# Patient Record
Sex: Male | Born: 1980 | Race: Black or African American | Hispanic: No | Marital: Single | Smoking: Former smoker
Health system: Southern US, Community
[De-identification: ages and names within clinical notes are randomized; demographics above are authoritative.]

## PROBLEM LIST (undated history)

## (undated) DIAGNOSIS — E119 Type 2 diabetes mellitus without complications: Secondary | ICD-10-CM

## (undated) DIAGNOSIS — I639 Cerebral infarction, unspecified: Secondary | ICD-10-CM

## (undated) DIAGNOSIS — E785 Hyperlipidemia, unspecified: Secondary | ICD-10-CM

## (undated) HISTORY — DX: Hyperlipidemia, unspecified: E78.5

## (undated) HISTORY — PX: WISDOM TOOTH EXTRACTION: SHX21

---

## 2003-09-16 ENCOUNTER — Emergency Department (HOSPITAL_COMMUNITY): Admission: EM | Admit: 2003-09-16 | Discharge: 2003-09-16 | Payer: Self-pay | Admitting: Emergency Medicine

## 2005-01-02 ENCOUNTER — Emergency Department (HOSPITAL_COMMUNITY): Admission: EM | Admit: 2005-01-02 | Discharge: 2005-01-02 | Payer: Self-pay | Admitting: *Deleted

## 2005-01-18 ENCOUNTER — Emergency Department (HOSPITAL_COMMUNITY): Admission: EM | Admit: 2005-01-18 | Discharge: 2005-01-18 | Payer: Self-pay | Admitting: Emergency Medicine

## 2005-04-01 ENCOUNTER — Emergency Department (HOSPITAL_COMMUNITY): Admission: EM | Admit: 2005-04-01 | Discharge: 2005-04-02 | Payer: Self-pay | Admitting: Emergency Medicine

## 2005-04-04 ENCOUNTER — Emergency Department (HOSPITAL_COMMUNITY): Admission: EM | Admit: 2005-04-04 | Discharge: 2005-04-04 | Payer: Self-pay | Admitting: Family Medicine

## 2005-04-08 ENCOUNTER — Emergency Department (HOSPITAL_COMMUNITY): Admission: EM | Admit: 2005-04-08 | Discharge: 2005-04-08 | Payer: Self-pay | Admitting: Family Medicine

## 2005-09-27 ENCOUNTER — Emergency Department (HOSPITAL_COMMUNITY): Admission: EM | Admit: 2005-09-27 | Discharge: 2005-09-27 | Payer: Self-pay | Admitting: Family Medicine

## 2005-09-28 ENCOUNTER — Emergency Department (HOSPITAL_COMMUNITY): Admission: EM | Admit: 2005-09-28 | Discharge: 2005-09-28 | Payer: Self-pay | Admitting: Emergency Medicine

## 2005-10-04 ENCOUNTER — Emergency Department (HOSPITAL_COMMUNITY): Admission: EM | Admit: 2005-10-04 | Discharge: 2005-10-04 | Payer: Self-pay | Admitting: Family Medicine

## 2006-07-28 ENCOUNTER — Emergency Department (HOSPITAL_COMMUNITY): Admission: EM | Admit: 2006-07-28 | Discharge: 2006-07-28 | Payer: Self-pay | Admitting: Family Medicine

## 2006-08-28 ENCOUNTER — Emergency Department (HOSPITAL_COMMUNITY): Admission: EM | Admit: 2006-08-28 | Discharge: 2006-08-28 | Payer: Self-pay | Admitting: Family Medicine

## 2006-08-29 ENCOUNTER — Emergency Department (HOSPITAL_COMMUNITY): Admission: EM | Admit: 2006-08-29 | Discharge: 2006-08-29 | Payer: Self-pay | Admitting: Emergency Medicine

## 2007-02-06 IMAGING — CT CT HEAD W/O CM
1 of 2 series · 16 of 30 positions shown, 20 images · IV contrast (agent unspecified)
Comparison: None

CLINICAL DATA: Syncope, laceration forehead

HEAD CT WITHOUT CONTRAST:
TECHNIQUE: 5mm collimated images were obtained from the base of the skull
through the vertex according to standard protocol without contrast.

[Series 2: head_seq 4.5 h42s st · axial · 0.43mm/px · z∈[+1159,+1285]mm · 16 of 32 slices shown, 20 images]
[im 2/32  brain]
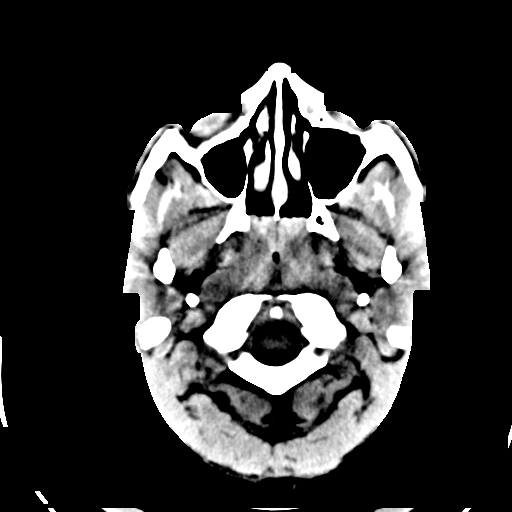
[im 2/32  bone]
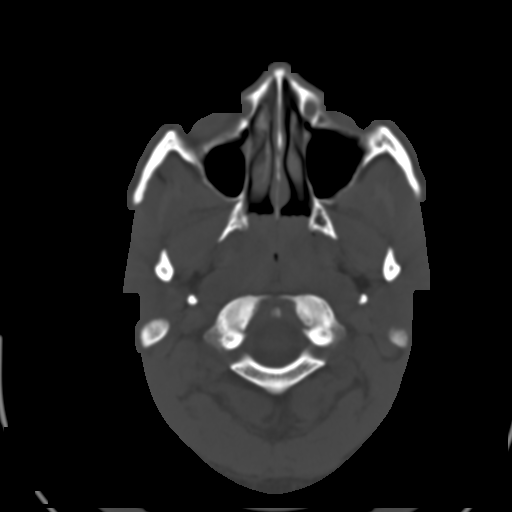
[im 4/32  brain]
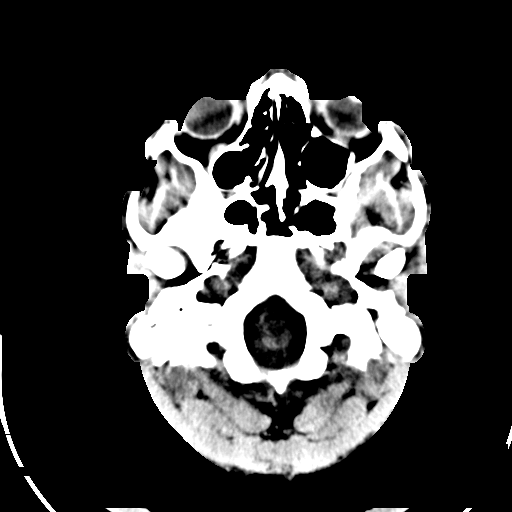
[im 5/32  brain]
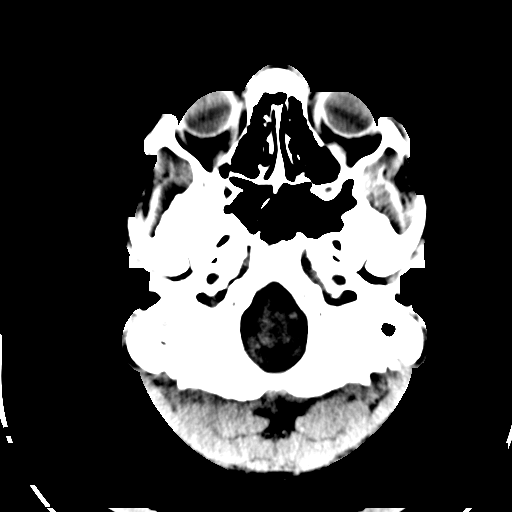
[im 8/32  brain]
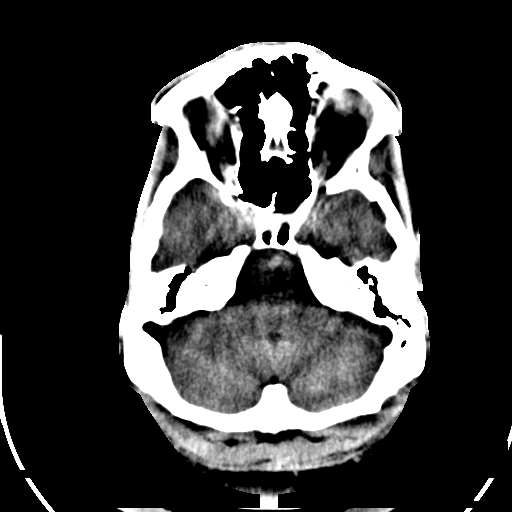
[im 10/32  brain]
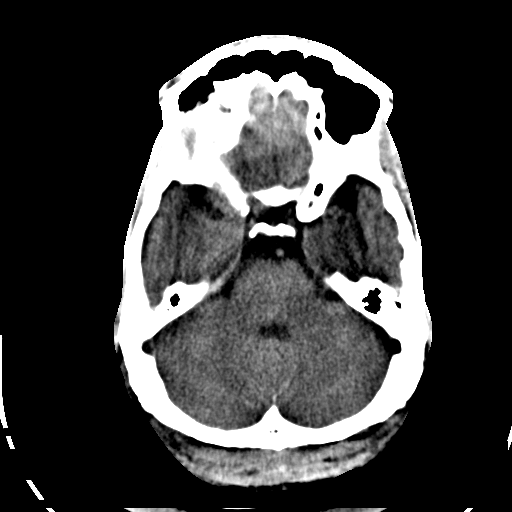
[im 10/32  bone]
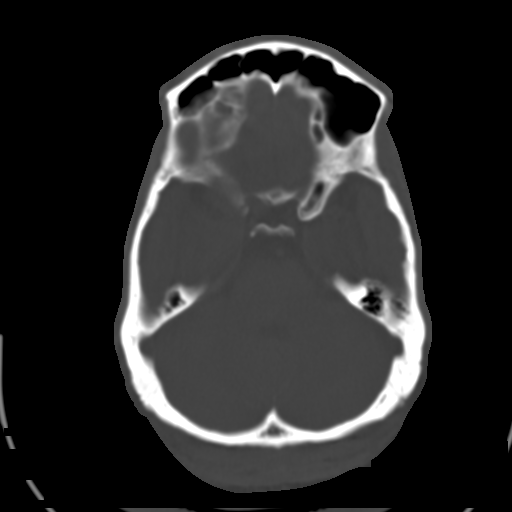
[im 11/32  brain]
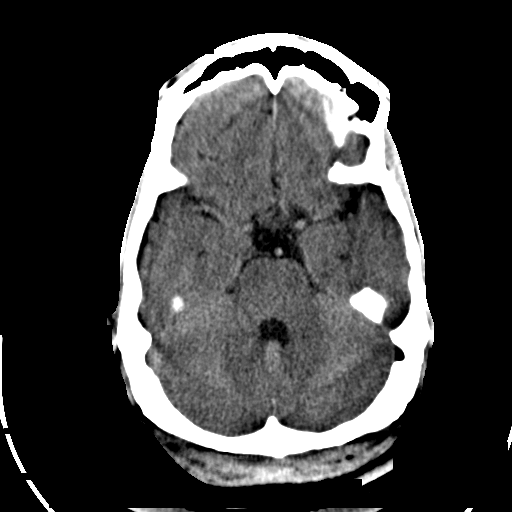
[im 14/32  brain]
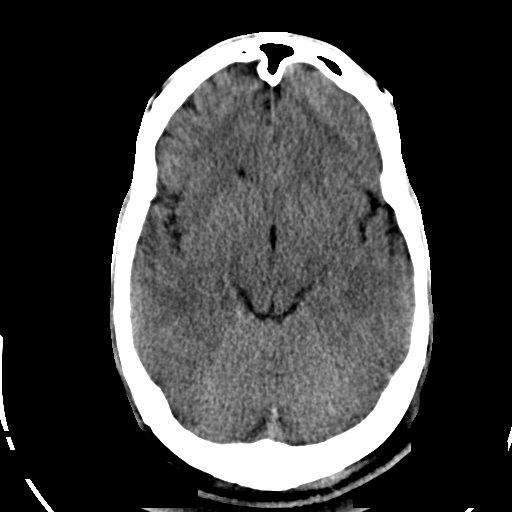
[im 15/32  brain]
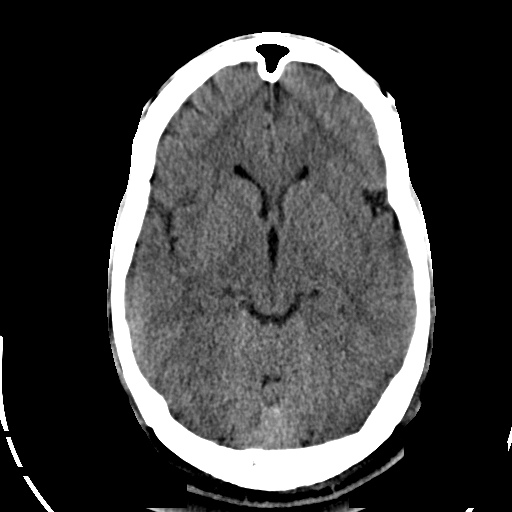
[im 17/32  brain]
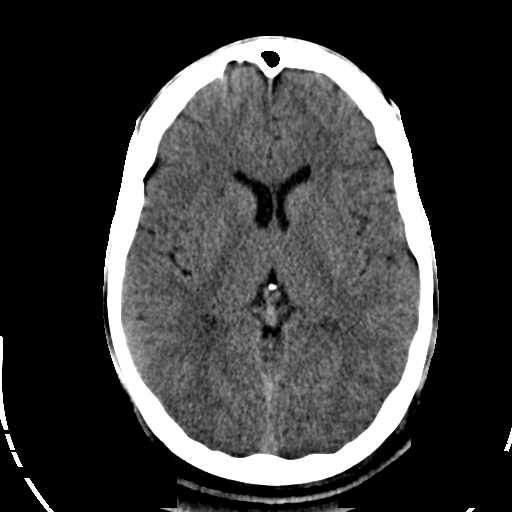
[im 17/32  bone]
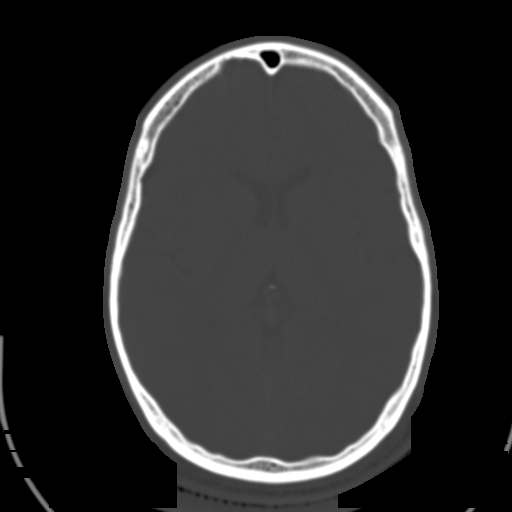
[im 18/32  brain]
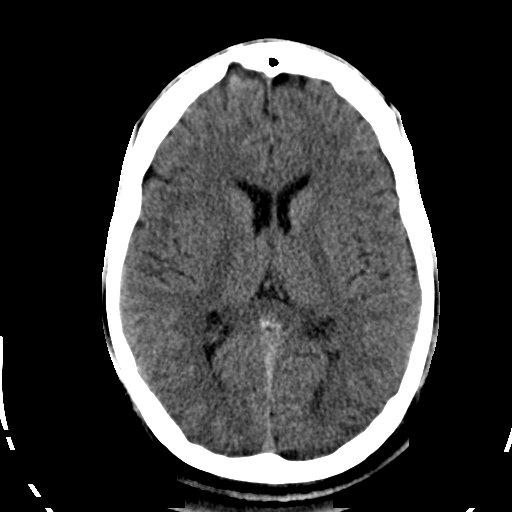
[im 21/32  brain]
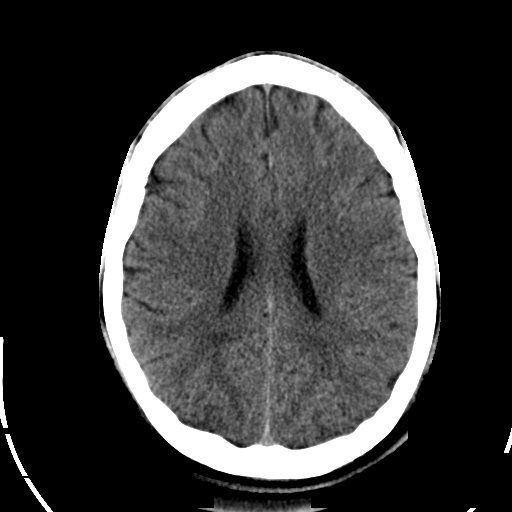
[im 22/32  brain]
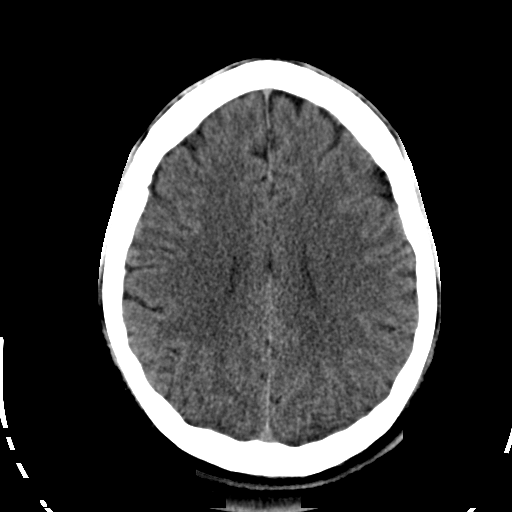
[im 24/32  brain]
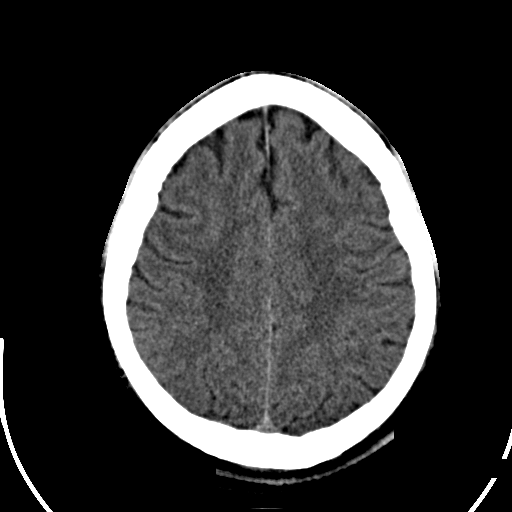
[im 24/32  bone]
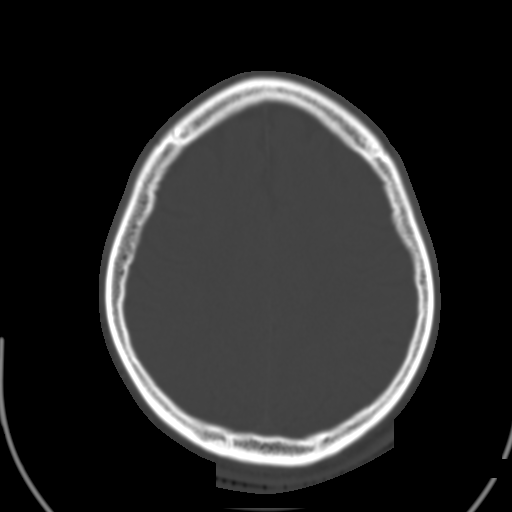
[im 27/32  brain]
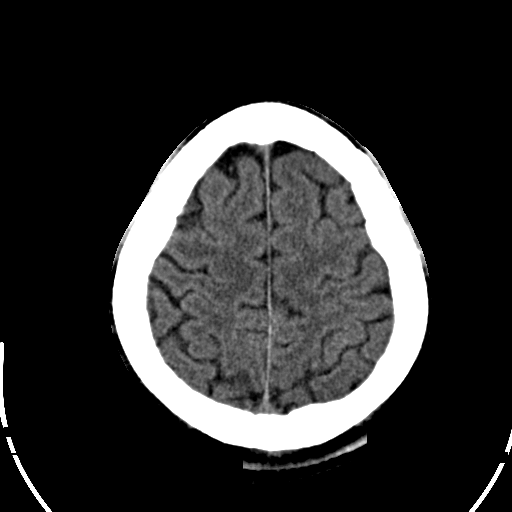
[im 28/32  brain]
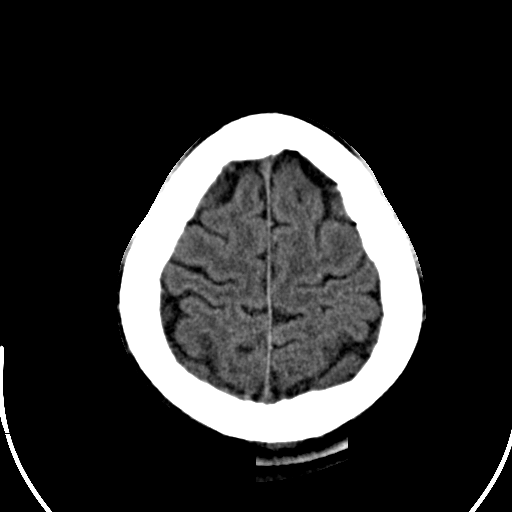
[im 30/32  brain]
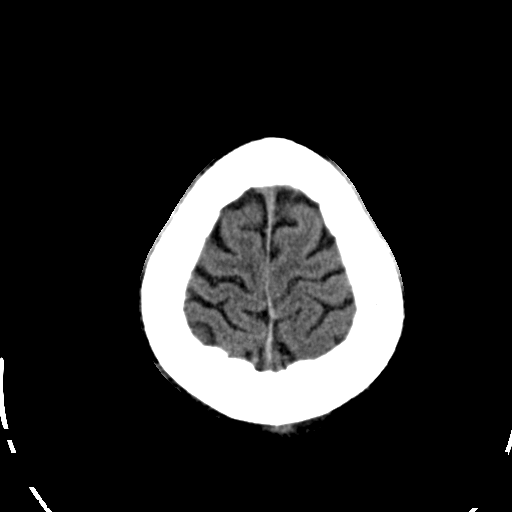

[16 of 30 positions shown; findings below may reference images not displayed]

FINDINGS: There is no evidence of intracranial hemorrhage, brain edema, or mass
effect.  No other intra-axial abnormalities are seen, and the ventricles are
within normal limits.  No abnormal extra-axial fluid collections or masses are
identified.  No skull abnormalities are noted.
IMPRESSION: No acute intracranial abnormality.

## 2007-02-25 ENCOUNTER — Emergency Department (HOSPITAL_COMMUNITY): Admission: EM | Admit: 2007-02-25 | Discharge: 2007-02-25 | Payer: Self-pay | Admitting: Emergency Medicine

## 2007-07-23 ENCOUNTER — Emergency Department (HOSPITAL_COMMUNITY): Admission: EM | Admit: 2007-07-23 | Discharge: 2007-07-23 | Payer: Self-pay | Admitting: Emergency Medicine

## 2007-07-23 ENCOUNTER — Emergency Department (HOSPITAL_COMMUNITY): Admission: EM | Admit: 2007-07-23 | Discharge: 2007-07-23 | Payer: Self-pay | Admitting: Family Medicine

## 2012-01-18 ENCOUNTER — Emergency Department (HOSPITAL_COMMUNITY)
Admission: EM | Admit: 2012-01-18 | Discharge: 2012-01-18 | Disposition: A | Discharge status: Home / Self Care | Payer: BC Managed Care – PPO | Attending: Emergency Medicine | Admitting: Emergency Medicine

## 2012-01-18 ENCOUNTER — Encounter (HOSPITAL_COMMUNITY): Payer: Self-pay | Admitting: *Deleted

## 2012-01-18 DIAGNOSIS — R21 Rash and other nonspecific skin eruption: Secondary | ICD-10-CM | POA: Insufficient documentation

## 2012-01-18 DIAGNOSIS — L309 Dermatitis, unspecified: Secondary | ICD-10-CM

## 2012-01-18 DIAGNOSIS — F17200 Nicotine dependence, unspecified, uncomplicated: Secondary | ICD-10-CM | POA: Insufficient documentation

## 2012-01-18 MED ORDER — HYDROCORTISONE 1 % EX CREA: TOPICAL_CREAM | CUTANEOUS | Status: AC

## 2012-01-18 MED ORDER — HYDROCORTISONE 1 % EX CREA: TOPICAL_CREAM | CUTANEOUS | Status: DC

## 2012-01-18 NOTE — Discharge Instructions (Signed)
Apply cream twice daily - return if symptoms worsen.  RESOURCE GUIDE  Dental Problems  Patients with Medicaid: [facility] [phone] [address]                                           [phone] [address] Phone:  [phone]                                                  Phone:  [phone]  If unable to pay or uninsured, contact:  [facility] or [facility]. to become qualified for the adult dental clinic.  Chronic Pain Problems Contact [facility]  [phone] Patients need to be referred by their primary care doctor.  Insufficient Money for Medicine Contact [employer]:  call "211" or [facility] [phone].  No Primary Care Doctor Call [facility]  [phone] Other agencies that provide inexpensive medical care    [facility]  [phone]    [facility]  [phone]    [facility]  [phone]    [facility]  [phone]    [facility]  [phone]    [facility]  [phone]  Psychological Services [facility]  [phone] [facility]  [phone] [facility]   [phone] (emergency services [phone])  Substance Abuse Resources [facility]  [phone] [facility] [phone] [facility] [place] [phone] Floydene Flock [phone] Residential & Outpatient Substance Abuse Program  [phone]  Abuse/Neglect [facility] Child Abuse Hotline [phone] [facility] Child Abuse Hotline (812)[phone] (After Hours)  Emergency Shelter [facility] [phone]  Maternity Homes [facility] at the [place] [facility] [place] [phone] [facility] [phone]  MRSA Hotline #:   (726) 461-5681    [facility]  [facility] [place]     [employer]                          [facility] Dept. [address]  [address] [place]                       [address]      [address]                                                  Cristobal Goldmann Phone:  [phone]                                   Phone:  [phone]                 Phone:  [phone]  [facility] Phone:  [phone]  [facility] Child Abuse Hotline 657-532-6608 [phone] (After Hours)

## 2012-01-18 NOTE — ED Provider Notes (Signed)
History  This chart was scribed for Vida Roller, MD by Cherlynn Perches. The patient was seen in room APA08/APA08. Patient's care was started at 2225.   CSN: [id number]  Arrival date & time 01/18/12  2225   First MD Initiated Contact with Patient 01/18/12 2254      Chief Complaint  Patient presents with  . Rash    (Consider location/radiation/quality/duration/timing/severity/associated sxs/prior treatment) HPI  Gregory Anthony is a 31 y.o. male who presents to the Emergency Department complaining of 1 week of gradual onset, persistent rash localized to the lower abdominal area and characterized by 5 raised red papules with associated itching. Pt denies rashes in other areas. Pt denies wearing a new belt or jeans recently. Pt denies fever and nausea.  No d/c from urethra, no lesions in mouth, no olesions on hands or between fingers.  Sx are mild.  No tx pta.    History reviewed. No pertinent past medical history.  History reviewed. No pertinent past surgical history.  No family history on file.  History  Substance Use Topics  . Smoking status: Current Everyday Smoker -- 0.5 packs/day    Types: Cigarettes  . Smokeless tobacco: Not on file  . Alcohol Use: Yes     social drinkers      Review of Systems  Constitutional: Negative for fever and chills.  Gastrointestinal: Negative for nausea and vomiting.  Skin: Positive for rash.    Allergies  Review of patient's allergies indicates no known allergies.  Home Medications  No current outpatient prescriptions on file.  Triage Vitals: BP 138/89  Pulse 82  Temp(Src) 98.4 F (36.9 C) (Oral)  Resp 20  Ht 5\' 5"  (1.651 m)  Wt 182 lb (82.555 kg)  BMI 30.29 kg/m2  SpO2 100%  Physical Exam  Nursing note and vitals reviewed. Constitutional: He is oriented to person, place, and time. He appears well-developed and well-nourished.  HENT:  Head: Normocephalic and atraumatic.       No lesions in mouth, normal MM  Eyes:  Conjunctivae are normal. No scleral icterus.  Neck: Normal range of motion. Neck supple.  Cardiovascular: Normal rate, regular rhythm and normal heart sounds.  Exam reveals no gallop and no friction rub.   No murmur heard. Pulmonary/Chest: Effort normal and breath sounds normal. No respiratory distress. He has no wheezes. He has no rales.  Musculoskeletal: Normal range of motion. He exhibits no edema.  Neurological: He is alert and oriented to person, place, and time. Coordination normal.  Skin: Skin is warm and dry. Rash (papular rash in suprapubic region, 5 individual spots, no surrounding erythema) noted. No petechiae and no purpura noted. Rash is papular.  Psychiatric: He has a normal mood and affect. His behavior is normal.    ED Course  Procedures (including critical care time)  DIAGNOSTIC STUDIES: Oxygen Saturation is 100% on room air, normal by my interpretation.    COORDINATION OF CARE: 11:08PM - Will give pt a cream to treat rash and give referral to PCP for follow-up. Pt agrees with plan.    Labs Reviewed - No data to display No results found.   No diagnosis found.    MDM  Mild - benign appearing, normal VS, likely dermatitis, steroid cream, f/u list given.      I personally performed the services described in this documentation, which was scribed in my presence. The recorded information has been reviewed and considered.       Vida Roller, MD 01/18/12 [phone]

## 2012-01-18 NOTE — ED Notes (Signed)
Pt presents with 5 raised red papules on lower abdominal area.

## 2016-11-30 ENCOUNTER — Encounter (HOSPITAL_COMMUNITY): Payer: Self-pay | Admitting: *Deleted

## 2016-11-30 ENCOUNTER — Emergency Department (HOSPITAL_COMMUNITY)
Admission: EM | Admit: 2016-11-30 | Discharge: 2016-11-30 | Disposition: A | Discharge status: Home / Self Care | Payer: No Typology Code available for payment source | Attending: Emergency Medicine | Admitting: Emergency Medicine

## 2016-11-30 DIAGNOSIS — Y92410 Unspecified street and highway as the place of occurrence of the external cause: Secondary | ICD-10-CM | POA: Insufficient documentation

## 2016-11-30 DIAGNOSIS — T148XXA Other injury of unspecified body region, initial encounter: Secondary | ICD-10-CM

## 2016-11-30 DIAGNOSIS — S79922A Unspecified injury of left thigh, initial encounter: Secondary | ICD-10-CM | POA: Diagnosis present

## 2016-11-30 DIAGNOSIS — S76912A Strain of unspecified muscles, fascia and tendons at thigh level, left thigh, initial encounter: Secondary | ICD-10-CM | POA: Diagnosis not present

## 2016-11-30 DIAGNOSIS — Y9389 Activity, other specified: Secondary | ICD-10-CM | POA: Insufficient documentation

## 2016-11-30 DIAGNOSIS — Y999 Unspecified external cause status: Secondary | ICD-10-CM | POA: Diagnosis not present

## 2016-11-30 DIAGNOSIS — F17210 Nicotine dependence, cigarettes, uncomplicated: Secondary | ICD-10-CM | POA: Insufficient documentation

## 2016-11-30 MED ORDER — CYCLOBENZAPRINE HCL 5 MG PO TABS: 5.0000 mg | ORAL_TABLET | Freq: Three times a day (TID) | ORAL | 0 refills | Status: DC | PRN

## 2016-11-30 MED ORDER — IBUPROFEN 800 MG PO TABS
800.0000 mg | ORAL_TABLET | Freq: Once | ORAL | Status: AC
  Administered 2016-11-30: 800 mg via ORAL
  Filled 2016-11-30: qty 1

## 2016-11-30 MED ORDER — IBUPROFEN 800 MG PO TABS: 800.0000 mg | ORAL_TABLET | Freq: Three times a day (TID) | ORAL | 0 refills | Status: DC
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
## 2016-12-03 ENCOUNTER — Encounter (HOSPITAL_COMMUNITY): Payer: Self-pay | Admitting: Emergency Medicine

## 2016-12-03 ENCOUNTER — Emergency Department (HOSPITAL_COMMUNITY): Payer: No Typology Code available for payment source

## 2016-12-03 ENCOUNTER — Emergency Department (HOSPITAL_COMMUNITY)
Admission: EM | Admit: 2016-12-03 | Discharge: 2016-12-04 | Disposition: A | Discharge status: Home / Self Care | Payer: No Typology Code available for payment source | Attending: Emergency Medicine | Admitting: Emergency Medicine

## 2016-12-03 DIAGNOSIS — M79652 Pain in left thigh: Secondary | ICD-10-CM | POA: Diagnosis not present

## 2016-12-03 DIAGNOSIS — R0781 Pleurodynia: Secondary | ICD-10-CM | POA: Insufficient documentation

## 2016-12-03 DIAGNOSIS — Y999 Unspecified external cause status: Secondary | ICD-10-CM | POA: Diagnosis not present

## 2016-12-03 DIAGNOSIS — Z79899 Other long term (current) drug therapy: Secondary | ICD-10-CM | POA: Insufficient documentation

## 2016-12-03 DIAGNOSIS — F17210 Nicotine dependence, cigarettes, uncomplicated: Secondary | ICD-10-CM | POA: Diagnosis not present

## 2016-12-03 DIAGNOSIS — S76012A Strain of muscle, fascia and tendon of left hip, initial encounter: Secondary | ICD-10-CM | POA: Insufficient documentation

## 2016-12-03 DIAGNOSIS — Y92410 Unspecified street and highway as the place of occurrence of the external cause: Secondary | ICD-10-CM | POA: Diagnosis not present

## 2016-12-03 DIAGNOSIS — Y939 Activity, unspecified: Secondary | ICD-10-CM | POA: Insufficient documentation

## 2016-12-03 DIAGNOSIS — T148XXA Other injury of unspecified body region, initial encounter: Secondary | ICD-10-CM

## 2016-12-03 DIAGNOSIS — M25562 Pain in left knee: Secondary | ICD-10-CM | POA: Diagnosis not present

## 2016-12-03 DIAGNOSIS — S79912A Unspecified injury of left hip, initial encounter: Secondary | ICD-10-CM | POA: Diagnosis present

## 2016-12-03 MED ORDER — IBUPROFEN 800 MG PO TABS
800.0000 mg | ORAL_TABLET | Freq: Once | ORAL | Status: AC
  Administered 2016-12-04: 800 mg via ORAL
  Filled 2016-12-03: qty 1
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
## 2016-12-04 NOTE — ED Notes (Signed)
Pt in xray

## 2016-12-04 NOTE — Discharge Instructions (Signed)
You may alternate Tylenol 1000 mg every 6 hours as needed for pain and Ibuprofen 800 mg every 8 hours as needed for pain.  Please take Ibuprofen with food. ° °

## 2017-11-02 ENCOUNTER — Encounter (HOSPITAL_COMMUNITY): Payer: Self-pay | Admitting: Emergency Medicine

## 2017-11-02 ENCOUNTER — Other Ambulatory Visit: Payer: Self-pay

## 2017-11-02 ENCOUNTER — Emergency Department (HOSPITAL_COMMUNITY): Admission: EM | Admit: 2017-11-02 | Discharge: 2017-11-02 | Discharge status: Absent without leave | Payer: Self-pay

## 2017-11-02 HISTORY — DX: Cerebral infarction, unspecified: I63.9

## 2017-11-02 HISTORY — DX: Type 2 diabetes mellitus without complications: E11.9

## 2017-11-02 NOTE — ED Notes (Signed)
Chart opened and registered in error on 11/02/17.
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
## 2019-11-17 ENCOUNTER — Ambulatory Visit: Payer: Self-pay | Admitting: Registered Nurse

## 2019-12-07 ENCOUNTER — Ambulatory Visit: Payer: Self-pay | Admitting: Registered Nurse

## 2019-12-08 ENCOUNTER — Encounter: Payer: Self-pay | Admitting: Registered Nurse

## 2020-10-25 ENCOUNTER — Telehealth: Payer: Self-pay | Admitting: Internal Medicine

## 2020-11-21 ENCOUNTER — Encounter: Payer: Self-pay | Admitting: Internal Medicine

## 2020-11-21 ENCOUNTER — Telehealth (INDEPENDENT_AMBULATORY_CARE_PROVIDER_SITE_OTHER): Payer: Self-pay | Admitting: Internal Medicine

## 2020-11-21 ENCOUNTER — Other Ambulatory Visit: Payer: Self-pay

## 2020-11-21 VITALS — Ht 66.0 in

## 2020-11-21 DIAGNOSIS — Z7689 Persons encountering health services in other specified circumstances: Secondary | ICD-10-CM

## 2020-11-21 NOTE — Progress Notes (Signed)
Virtual Visit via Telephone Note  I connected with Gregory Anthony, on 11/21/2020 at 1:56 PM by telephone due to the COVID-19 pandemic and verified that I am speaking with the correct person using two identifiers.   Consent: I discussed the limitations, risks, security and privacy concerns of performing an evaluation and management service by telephone and the availability of in person appointments. I also discussed with the patient that there may be a patient responsible charge related to this service. The patient expressed understanding and agreed to proceed.   Location of Patient: Home   Location of Provider: Clinic    Persons participating in Telemedicine visit: Pieter Fooks [facility] Pollock-RN Dr. Earlene Plater   History of Present Illness: Patient has a visit to establish care. Was previously established in another nearby city, last seen about two years ago. Denies PMH including DM and h/o CVA, reports that runs in family but has not occurred for him personally. No surgical history. No prescription medications. Has no concerns. Wants to have annual exam scheduled. Previous smoker but quit two years ago.    Past Medical History:  Diagnosis Date  . Diabetes mellitus without complication (HCC)   . Stroke (cerebrum) (HCC)    No Known Allergies  No current outpatient medications on file prior to visit.   No current facility-administered medications on file prior to visit.    Observations/Objective: NAD. Speaking clearly.  Work of breathing normal.  Alert and oriented. Mood appropriate.   Assessment and Plan: 1. Encounter to establish care Reviewed patient's PMH, social history, surgical history, and medications.  Is overdue for annual exam, screening blood work, and health maintenance topics. Have asked patient to return for visit to address these items.    Follow Up Instructions: Annual exam with fasting labs    I discussed the assessment and treatment plan with the patient. The  patient was provided an opportunity to ask questions and all were answered. The patient agreed with the plan and demonstrated an understanding of the instructions.   The patient was advised to call back or seek an in-person evaluation if the symptoms worsen or if the condition fails to improve as anticipated.     I provided 7 minutes total of non-face-to-face time during this encounter including median intraservice time, reviewing previous notes, investigations, ordering medications, medical decision making, coordinating care and patient verbalized understanding at the end of the visit.    Marcy Siren, D.O. Primary Care at [facility]  11/21/2020, 1:56 PM

## 2020-12-14 ENCOUNTER — Ambulatory Visit (INDEPENDENT_AMBULATORY_CARE_PROVIDER_SITE_OTHER): Payer: Commercial Managed Care - PPO | Admitting: Internal Medicine

## 2020-12-14 ENCOUNTER — Other Ambulatory Visit: Payer: Self-pay

## 2020-12-14 ENCOUNTER — Encounter: Payer: Self-pay | Admitting: Internal Medicine

## 2020-12-14 VITALS — BP 127/87 | HR 80 | Temp 97.5°F | Resp 16 | Ht 69.75 in | Wt 198.0 lb

## 2020-12-14 DIAGNOSIS — Z13228 Encounter for screening for other metabolic disorders: Secondary | ICD-10-CM

## 2020-12-14 DIAGNOSIS — Z1159 Encounter for screening for other viral diseases: Secondary | ICD-10-CM | POA: Diagnosis not present

## 2020-12-14 DIAGNOSIS — Z23 Encounter for immunization: Secondary | ICD-10-CM | POA: Diagnosis not present

## 2020-12-14 DIAGNOSIS — Z114 Encounter for screening for human immunodeficiency virus [HIV]: Secondary | ICD-10-CM

## 2020-12-14 DIAGNOSIS — Z0000 Encounter for general adult medical examination without abnormal findings: Secondary | ICD-10-CM | POA: Diagnosis not present

## 2020-12-14 DIAGNOSIS — Z130 Encounter for screening for diseases of the blood and blood-forming organs and certain disorders involving the immune mechanism: Secondary | ICD-10-CM | POA: Diagnosis not present

## 2020-12-14 DIAGNOSIS — L309 Dermatitis, unspecified: Secondary | ICD-10-CM

## 2020-12-14 NOTE — Patient Instructions (Signed)

## 2020-12-14 NOTE — Progress Notes (Signed)
Subjective:    Gregory Anthony - 40 y.o. male MRN [id number]  Date of birth: March 02, 1981  HPI  Yoel Kaufhold is here for annual exam. Has concerns about dry, peeling skin on palms. Uses Triamcinolone cream which seems to hep. Uses Jergens lotion occasionally.   Works out at least 4x a week with walking, running, and weight lifting. Has received two parts of COVID vaccine but not booster.   Depression screen [facility] 2/9 12/14/2020 11/21/2020  Decreased Interest 0 0  Down, Depressed, Hopeless 0 0  PHQ - 2 Score 0 0  Altered sleeping 0 -  Tired, decreased energy 0 -  Change in appetite 0 -  Feeling bad or failure about yourself  0 -  Trouble concentrating 0 -  Moving slowly or fidgety/restless 0 -  Suicidal thoughts 0 -  PHQ-9 Score 0 -       Health Maintenance:  Health Maintenance Due  Topic Date Due  . Hepatitis C Screening  Never done  . COVID-19 Vaccine (1) Never done  . HIV Screening  Never done  . TETANUS/TDAP  Never done    -  reports that he quit smoking about 2 years ago. He has never used smokeless tobacco. - Review of Systems: Per HPI. - Past Medical History: There are no problems to display for this patient.  - Medications: reviewed and updated   Objective:   Physical Exam BP 127/87   Pulse 80   Temp (!) 97.5 F (36.4 C)   Resp 16   Wt 198 lb (89.8 kg)   SpO2 98%   BMI 31.96 kg/m  Physical Exam Constitutional:      Appearance: He is not diaphoretic.  HENT:     Head: Normocephalic and atraumatic.  Eyes:     Conjunctiva/sclera: Conjunctivae normal.     Pupils: Pupils are equal, round, and reactive to light.  Neck:     Thyroid: No thyromegaly.  Cardiovascular:     Rate and Rhythm: Normal rate and regular rhythm.     Heart sounds: Normal heart sounds. No murmur heard.   Pulmonary:     Effort: Pulmonary effort is normal. No respiratory distress.     Breath sounds: Normal breath sounds. No wheezing.  Abdominal:     General: Bowel sounds are normal.  There is no distension.     Palpations: Abdomen is soft.     Tenderness: There is no abdominal tenderness. There is no guarding or rebound.  Musculoskeletal:        General: No deformity. Normal range of motion.     Cervical back: Normal range of motion and neck supple.  Lymphadenopathy:     Cervical: No cervical adenopathy.  Skin:    General: Skin is warm and dry.     Findings: No rash.  Neurological:     Mental Status: He is alert and oriented to person, place, and time.     Gait: Gait is intact.  Psychiatric:        Mood and Affect: Mood and affect normal.        Judgment: Judgment normal.            Assessment & Plan:   1. Encounter for annual physical exam Counseled on 150 minutes of exercise per week, healthy eating (including decreased daily intake of saturated fats, cholesterol, added sugars, sodium), STI prevention, routine healthcare maintenance.  2. Screening for metabolic disorder - Comprehensive metabolic panel - Lipid panel  3. Screening for deficiency anemia -  CBC  4. Need for hepatitis C screening test - Hepatitis C antibody  5. Screening for HIV (human immunodeficiency virus) - HIV Antibody (routine testing w rflx)  6. Eczema, unspecified type Discussed using thicker emollient such as Vaseline or Aquaphor in between steroid use. Recommended application at night with glove or sock on top.   7. Need for Tdap vaccination - Tdap vaccine greater than or equal to 7yo IM   Marcy Siren, D.O. 12/14/2020, 9:16 AM Primary Care at [facility]

## 2020-12-14 NOTE — Progress Notes (Signed)
CPE Concerns with skin peeling on right hand

## 2020-12-15 LAB — COMPREHENSIVE METABOLIC PANEL
ALT: 32 IU/L (ref 0–44)
AST: 26 IU/L (ref 0–40)
Albumin/Globulin Ratio: 1.6 (ref 1.2–2.2)
Albumin: 4.5 g/dL (ref 4.0–5.0)
Alkaline Phosphatase: 79 IU/L (ref 44–121)
BUN/Creatinine Ratio: 9 (ref 9–20)
BUN: 11 mg/dL (ref 6–20)
Bilirubin Total: <0.2 mg/dL (ref 0.0–1.2)
CO2: 21 mmol/L (ref 20–29)
Calcium: 9.4 mg/dL (ref 8.7–10.2)
Chloride: 105 mmol/L (ref 96–106)
Creatinine, Ser: 1.2 mg/dL (ref 0.76–1.27)
Globulin, Total: 2.8 g/dL (ref 1.5–4.5)
Glucose: 105 mg/dL — ABNORMAL HIGH (ref 65–99)
Potassium: 4.1 mmol/L (ref 3.5–5.2)
Sodium: 144 mmol/L (ref 134–144)
Total Protein: 7.3 g/dL (ref 6.0–8.5)
eGFR: 79 mL/min/{1.73_m2} (ref >59)

## 2020-12-15 LAB — CBC
Hematocrit: 43.2 % (ref 37.5–51.0)
Hemoglobin: 14.5 g/dL (ref 13.0–17.7)
MCH: 28 pg (ref 26.6–33.0)
MCHC: 33.6 g/dL (ref 31.5–35.7)
MCV: 83 fL (ref 79–97)
Platelets: 244 10*3/uL (ref 150–450)
RBC: 5.18 x10E6/uL (ref 4.14–5.80)
RDW: 14.7 % (ref 11.6–15.4)
WBC: 11.3 10*3/uL — ABNORMAL HIGH (ref 3.4–10.8)

## 2020-12-15 LAB — LIPID PANEL
Chol/HDL Ratio: 4.5 ratio (ref 0.0–5.0)
Cholesterol, Total: 175 mg/dL (ref 100–199)
HDL: 39 mg/dL — ABNORMAL LOW (ref >39)
LDL Chol Calc (NIH): 112 mg/dL — ABNORMAL HIGH (ref 0–99)
Triglycerides: 135 mg/dL (ref 0–149)
VLDL Cholesterol Cal: 24 mg/dL (ref 5–40)

## 2020-12-15 LAB — HIV ANTIBODY (ROUTINE TESTING W REFLEX): HIV Screen 4th Generation wRfx: NONREACTIVE

## 2020-12-15 LAB — HEPATITIS C ANTIBODY: Hep C Virus Ab: <0.1 s/co ratio (ref 0.0–0.9)

## 2020-12-16 ENCOUNTER — Encounter: Payer: Self-pay | Admitting: Internal Medicine

## 2021-01-10 ENCOUNTER — Telehealth: Payer: Self-pay | Admitting: Family

## 2022-04-10 NOTE — Progress Notes (Unsigned)
   New Patient Visit  There were no vitals taken for this visit.   Subjective:    Patient ID: Gregory Anthony, male    DOB: [birth year], 42 y.o.   MRN: [id number]  CC: No chief complaint on file.   HPI: Gregory Anthony is a 41 y.o. male presents for new patient visit to establish care.  Introduced to Publishing rights manager role and practice setting.  All questions answered.  Discussed provider/patient relationship and expectations.   No past medical history on file.  Past Surgical History:  Procedure Laterality Date   WISDOM TOOTH EXTRACTION      No family history on file.   Social History   Tobacco Use   Smoking status: Former    Types: Cigarettes    Quit date: 11/22/2018    Years since quitting: 3.3   Smokeless tobacco: Never  Vaping Use   Vaping Use: Never used  Substance Use Topics   Alcohol use: Yes    Comment: occ   Drug use: No    No current outpatient medications on file prior to visit.   No current facility-administered medications on file prior to visit.     Review of Systems      Objective:    There were no vitals taken for this visit.  Wt Readings from Last 3 Encounters:  12/14/20 198 lb (89.8 kg)  11/30/16 190 lb (86.2 kg)  01/18/12 182 lb (82.6 kg)    BP Readings from Last 3 Encounters:  12/14/20 127/87  12/04/16 111/63  11/30/16 130/81    Physical Exam     Assessment & Plan:   Problem List Items Addressed This Visit   None    Follow up plan: No follow-ups on file.

## 2022-04-11 ENCOUNTER — Encounter: Payer: Self-pay | Admitting: Nurse Practitioner

## 2022-04-11 ENCOUNTER — Ambulatory Visit (INDEPENDENT_AMBULATORY_CARE_PROVIDER_SITE_OTHER): Payer: Commercial Managed Care - PPO | Admitting: Nurse Practitioner

## 2022-04-11 VITALS — BP 124/84 | HR 64 | Temp 97.5°F | Ht 69.75 in | Wt 188.2 lb

## 2022-04-11 DIAGNOSIS — E785 Hyperlipidemia, unspecified: Secondary | ICD-10-CM | POA: Diagnosis not present

## 2022-04-11 DIAGNOSIS — Z0000 Encounter for general adult medical examination without abnormal findings: Secondary | ICD-10-CM | POA: Diagnosis not present

## 2022-04-11 DIAGNOSIS — R7301 Impaired fasting glucose: Secondary | ICD-10-CM

## 2022-04-11 LAB — COMPREHENSIVE METABOLIC PANEL
ALT: 20 U/L (ref 0–53)
AST: 21 U/L (ref 0–37)
Albumin: 4.5 g/dL (ref 3.5–5.2)
Alkaline Phosphatase: 66 U/L (ref 39–117)
BUN: 17 mg/dL (ref 6–23)
CO2: 32 mEq/L (ref 19–32)
Calcium: 9.4 mg/dL (ref 8.4–10.5)
Chloride: 102 mEq/L (ref 96–112)
Creatinine, Ser: 1.13 mg/dL (ref 0.40–1.50)
GFR: 80.86 mL/min (ref >60.00)
Glucose, Bld: 92 mg/dL (ref 70–99)
Potassium: 4.2 mEq/L (ref 3.5–5.1)
Sodium: 140 mEq/L (ref 135–145)
Total Bilirubin: 0.2 mg/dL (ref 0.2–1.2)
Total Protein: 7.5 g/dL (ref 6.0–8.3)

## 2022-04-11 LAB — CBC
HCT: 42.6 % (ref 39.0–52.0)
Hemoglobin: 13.8 g/dL (ref 13.0–17.0)
MCHC: 32.5 g/dL (ref 30.0–36.0)
MCV: 85.1 fl (ref 78.0–100.0)
Platelets: 237 10*3/uL (ref 150.0–400.0)
RBC: 5.01 Mil/uL (ref 4.22–5.81)
RDW: 15.1 % (ref 11.5–15.5)
WBC: 10 10*3/uL (ref 4.0–10.5)

## 2022-04-11 LAB — LIPID PANEL
Cholesterol: 155 mg/dL (ref 0–200)
HDL: 48.9 mg/dL (ref >39.00)
LDL Cholesterol: 82 mg/dL (ref 0–99)
NonHDL: 106.59
Total CHOL/HDL Ratio: 3
Triglycerides: 125 mg/dL (ref 0.0–149.0)
VLDL: 25 mg/dL (ref 0.0–40.0)

## 2022-04-11 LAB — HEMOGLOBIN A1C: Hgb A1c MFr Bld: 6.3 % (ref 4.6–6.5)

## 2022-04-11 NOTE — Assessment & Plan Note (Signed)
He has a history of elevated cholesterol, will check lipid panel today and treat based on results.

## 2022-04-11 NOTE — Patient Instructions (Signed)
It was great to see you!  We are checking your labs today and will let you know the results via mychart/phone.   Let's follow-up in 1 year, sooner if you have concerns.  If a referral was placed today, you will be contacted for an appointment. Please note that routine referrals can sometimes take up to 3-4 weeks to process. Please call our office if you haven't heard anything after this time frame.  Take care,  Johnothan Bascomb, NP  

## 2022-06-06 ENCOUNTER — Ambulatory Visit: Payer: Commercial Managed Care - PPO | Admitting: Nurse Practitioner

## 2022-10-18 ENCOUNTER — Encounter: Payer: Self-pay | Admitting: *Deleted

## 2022-10-18 NOTE — Progress Notes (Addendum)
Pt seen at 10/06/22 event when b/p was 125/92. CMA at event confirmed pt has PCP Gregory Peper, NP and chart review notes pt  last saw PCP on 04/11/22 and has future appt for 04/15/23 - no additional SDOH needs noted at this event.  No additional health equity team support indicated at this time.

## 2022-11-20 ENCOUNTER — Encounter: Payer: Self-pay | Admitting: Nurse Practitioner

## 2022-11-20 ENCOUNTER — Ambulatory Visit (INDEPENDENT_AMBULATORY_CARE_PROVIDER_SITE_OTHER): Payer: Commercial Managed Care - PPO | Admitting: Nurse Practitioner

## 2022-11-20 VITALS — BP 120/86 | HR 70 | Temp 98.5°F | Ht 69.75 in | Wt 189.4 lb

## 2022-11-20 DIAGNOSIS — F3181 Bipolar II disorder: Secondary | ICD-10-CM

## 2022-11-20 DIAGNOSIS — F411 Generalized anxiety disorder: Secondary | ICD-10-CM

## 2022-11-20 DIAGNOSIS — G4700 Insomnia, unspecified: Secondary | ICD-10-CM | POA: Diagnosis not present

## 2022-11-20 NOTE — Patient Instructions (Signed)
It was great to see you!  I am going to fill out your short term disability paperwork, starting today through May 7. Return to work May 8.   Keep following with psychiatry and therapy.   Let's follow-up in 5 weeks, sooner if you have concerns.  If a referral was placed today, you will be contacted for an appointment. Please note that routine referrals can sometimes take up to 3-4 weeks to process. Please call our office if you haven't heard anything after this time frame.  Take care,  Vance Peper, NP

## 2022-11-20 NOTE — Progress Notes (Unsigned)
   Established Patient Office Visit  Subjective   Patient ID: Gregory Anthony, male    DOB: 1980-11-30  Age: 42 y.o. MRN: [id number]  Chief Complaint  Patient presents with   Anxiety    He recently established with psychiatry, needs FMLA paperwork filled out    HPI  Gregory Anthony is here to follow-up on recent diagnosis of generalized anxiety disorder, insomnia, panic disorder, bipolar 2, and alcohol abuse.   He states that he just received full time custody of his 79 year old son and has been having a hard time adjusting and having increased anxiety. He established with psychiatry and a therapist. He was started on lamictal 50mg  daily and hydroxyzine 10-20mg  at bedtime as needed for anxiety and sleep. They recommend he take a leave of absence from work for 6 weeks while he is adjusting to the medication and going to therapy. Psychiatry does not fill out this paperwork and he is requesting to have it done here. So far, he has noticed some fatigue from his medication, however no other side effects. He denies SI/HI.     11/20/2022   11:48 AM 04/11/2022   10:49 AM 12/14/2020    9:16 AM 11/21/2020    1:56 PM  Depression screen PHQ 2/9  Decreased Interest 0 0 0 0  Down, Depressed, Hopeless 3 0 0 0  PHQ - 2 Score 3 0 0 0  Altered sleeping 3  0   Tired, decreased energy 3  0   Change in appetite 0  0   Feeling bad or failure about yourself  1  0   Trouble concentrating 0  0   Moving slowly or fidgety/restless 0  0   Suicidal thoughts 0  0   PHQ-9 Score 10  0   Difficult doing work/chores Very difficult         11/20/2022   11:48 AM 12/14/2020    9:16 AM 11/21/2020    1:56 PM  GAD 7 : Generalized Anxiety Score  Nervous, Anxious, on Edge 1 0 0  Control/stop worrying 1 0 0  Worry too much - different things 1 0 0  Trouble relaxing 1 0 0  Restless 3 0 0  Easily annoyed or irritable 3 0 0  Afraid - awful might happen 0 0 0  Total GAD 7 Score 10 0 0    {History (Optional):23778}  ROS     Objective:     BP 120/86 (BP Location: Right Arm)   Pulse 70   Temp 98.5 F (36.9 C)   Ht 5' 9.75" (1.772 m)   Wt 189 lb 6.4 oz (85.9 kg)   SpO2 99%   BMI 27.37 kg/m  {Vitals History (Optional):23777}  Physical Exam   No results found for any visits on 11/20/22.  {Labs (Optional):23779}  The 10-year ASCVD risk score (Arnett DK, et al., 2019) is: 4.7%    Assessment & Plan:   Problem List Items Addressed This Visit   None   Return in about 5 weeks (around 12/25/2022) for Anxiety, Depression may be virtual .    Charyl Dancer, NP

## 2022-11-21 ENCOUNTER — Encounter: Payer: Self-pay | Admitting: Nurse Practitioner

## 2022-11-21 ENCOUNTER — Telehealth: Payer: Self-pay

## 2022-11-21 DIAGNOSIS — G4700 Insomnia, unspecified: Secondary | ICD-10-CM | POA: Insufficient documentation

## 2022-11-21 DIAGNOSIS — F411 Generalized anxiety disorder: Secondary | ICD-10-CM | POA: Insufficient documentation

## 2022-11-21 DIAGNOSIS — F3181 Bipolar II disorder: Secondary | ICD-10-CM | POA: Insufficient documentation

## 2022-11-21 NOTE — Assessment & Plan Note (Signed)
Chronic, ongoing.  He recently established with psychiatry and a therapist.  He was started on Lamictal 50 mg daily and hydroxyzine 10 to 20 mg as needed at bedtime for anxiety and sleep.  His PHQ-9 is a 10 and his GAD-7 is a 10 today.  He denies SI/HI.  Psychiatry recommends taking a leave of absence for 6 weeks from work.  Will complete any paperwork required for this.  He will continue to follow with psychiatry every 3 weeks and a therapist once a week.  Continue collaboration recommendations from psychiatry and therapy.  Follow-up in 5 weeks.

## 2022-11-21 NOTE — Assessment & Plan Note (Signed)
Chronic, ongoing.  He can continue hydroxyzine 10 to 20 mg at bedtime as needed for insomnia.  Continue collaboration send recommendations from therapy and psychiatry.

## 2022-11-21 NOTE — Telephone Encounter (Signed)
LVM to return call.    Fax number provided on card will not allow fax to go through.

## 2022-11-22 NOTE — Telephone Encounter (Signed)
LVM to return call.

## 2022-11-26 NOTE — Telephone Encounter (Signed)
I spoke with patient and I will fax paper work to Gregory Anthony per his request and he will pick up paperwork

## 2022-11-26 NOTE — Telephone Encounter (Signed)
Pt would like to come pick the papers up rather than fax them. Please notify when up here.

## 2022-11-26 NOTE — Telephone Encounter (Signed)
Please fax to [phone]

## 2022-11-28 ENCOUNTER — Ambulatory Visit: Payer: Commercial Managed Care - PPO | Admitting: Nurse Practitioner

## 2022-12-25 ENCOUNTER — Encounter: Payer: Self-pay | Admitting: Nurse Practitioner

## 2022-12-25 ENCOUNTER — Telehealth (INDEPENDENT_AMBULATORY_CARE_PROVIDER_SITE_OTHER): Payer: Commercial Managed Care - PPO | Admitting: Nurse Practitioner

## 2022-12-25 VITALS — Ht 69.75 in | Wt 190.0 lb

## 2022-12-25 DIAGNOSIS — F3181 Bipolar II disorder: Secondary | ICD-10-CM | POA: Diagnosis not present

## 2022-12-25 DIAGNOSIS — F411 Generalized anxiety disorder: Secondary | ICD-10-CM | POA: Diagnosis not present

## 2022-12-25 NOTE — Progress Notes (Signed)
[facility] [address] [place] [address] Dept: [phone] Dept Fax: [phone]  Virtual Video Visit  I connected with Gregory Anthony on 12/25/22 at  8:40 AM EDT by a video enabled telemedicine application and verified that I am speaking with the correct person using two identifiers.  Location patient: Home Location provider: Clinic Persons participating in the virtual visit: Patient; Rodman Pickle, NP; Malena Peer, CMA  I discussed the limitations of evaluation and management by telemedicine and the availability of in person appointments. The patient expressed understanding and agreed to proceed.  Chief Complaint  Patient presents with   Anxiety    Follow up, no concerns    SUBJECTIVE:  HPI: Gregory Anthony is a 42 y.o. male who presents to follow-up on bipolar and anxiety.  He has been following with psychiatry and going to therapy weekly.  He has been on leave of absence from work and is doing really well.  He states that they did have to adjust his regimen slightly, however all he is doing really well.  He is ready to go back to work on 01/02/23.  He denies SI/HI.     12/25/2022    8:44 AM 11/20/2022   11:48 AM 04/11/2022   10:49 AM 12/14/2020    9:16 AM 11/21/2020    1:56 PM  Depression screen PHQ 2/9  Decreased Interest 0 0 0 0 0  Down, Depressed, Hopeless 0 3 0 0 0  PHQ - 2 Score 0 3 0 0 0  Altered sleeping 0 3  0   Tired, decreased energy 0 3  0   Change in appetite 0 0  0   Feeling bad or failure about yourself  0 1  0   Trouble concentrating 0 0  0   Moving slowly or fidgety/restless 0 0  0   Suicidal thoughts 0 0  0   PHQ-9 Score 0 10  0   Difficult doing work/chores Not difficult at all Very difficult         12/25/2022    8:45 AM 11/20/2022   11:48 AM 12/14/2020    9:16 AM 11/21/2020    1:56 PM  GAD 7 : Generalized Anxiety Score  Nervous, Anxious, on Edge 0 1 0 0  Control/stop worrying 0 1  0 0  Worry too much - different things 0 1 0 0  Trouble relaxing 0 1 0 0  Restless 0 3 0 0  Easily annoyed or irritable 0 3 0 0  Afraid - awful might happen 0 0 0 0  Total GAD 7 Score 0 10 0 0  Anxiety Difficulty Not difficult at all       Patient Active Problem List   Diagnosis Date Noted   Generalized anxiety disorder 11/21/2022   Bipolar 2 disorder (HCC) 11/21/2022   Insomnia 11/21/2022   Hyperlipidemia 04/11/2022    Past Surgical History:  Procedure Laterality Date   WISDOM TOOTH EXTRACTION      Family History  Problem Relation Age of Onset   Diabetes Maternal Grandmother     Social History   Tobacco Use   Smoking status: Former    Packs/day: 0.25    Years: 22.00    Additional pack years: 0.00    Total pack years: 5.50    Types: Cigarettes    Quit date: 11/26/2018    Years since quitting: 4.0   Smokeless tobacco: Never  Vaping Use   Vaping Use:  Never used  Substance Use Topics   Alcohol use: Not Currently    Alcohol/week: 1.0 standard drink of alcohol    Types: 1 Standard drinks or equivalent per week    Comment: occ   Drug use: Yes    Types: Marijuana     Current Outpatient Medications:    hydrOXYzine (ATARAX) 10 MG tablet, Take 10-20 mg by mouth daily as needed., Disp: , Rfl:    lamoTRIgine (LAMICTAL) 25 MG tablet, Take 50 mg by mouth daily., Disp: , Rfl:   No Known Allergies  ROS: See pertinent positives and negatives per HPI.  OBSERVATIONS/OBJECTIVE:  VITALS per patient if applicable: Today's Vitals   12/25/22 0844  Weight: 190 lb (86.2 kg)  Height: 5' 9.75" (1.772 m)   Body mass index is 27.46 kg/m.    GENERAL: Alert and oriented. Appears well and in no acute distress.  HEENT: Atraumatic. Conjunctiva clear. No obvious abnormalities on inspection of external nose and ears.  NECK: Normal movements of the head and neck.  LUNGS: On inspection, no signs of respiratory distress. Breathing rate appears normal. No obvious gross SOB, gasping  or wheezing, and no conversational dyspnea.  CV: No obvious cyanosis.  MS: Moves all visible extremities without noticeable abnormality.  PSYCH/NEURO: Pleasant and cooperative. No obvious depression or anxiety. Speech and thought processing grossly intact.  ASSESSMENT AND PLAN:  Problem List Items Addressed This Visit       Other   Generalized anxiety disorder    Chronic, stable.  Continue Lamictal 50 mg daily.  He is continuing to follow with psychiatry and a therapist.  He is going to return to work on 01/02/2023.  He denies SI/HI.  His PHQ-9 and GAD-7 are both zeros today.  Keep scheduled appointment in August.      Bipolar 2 disorder (HCC) - Primary    Chronic, stable.  Continue Lamictal 50 mg daily.  He is continuing to follow with psychiatry and a therapist.  He is going to return to work on 01/02/2023.  He denies SI/HI.  His PHQ-9 and GAD-7 are both zeros today.  Keep scheduled appointment in August.        I discussed the assessment and treatment plan with the patient. The patient was provided an opportunity to ask questions and all were answered. The patient agreed with the plan and demonstrated an understanding of the instructions.   The patient was advised to call back or seek an in-person evaluation if the symptoms worsen or if the condition fails to improve as anticipated.   Gerre Scull, NP

## 2022-12-25 NOTE — Assessment & Plan Note (Signed)
Chronic, stable.  Continue Lamictal 50 mg daily.  He is continuing to follow with psychiatry and a therapist.  He is going to return to work on 01/02/2023.  He denies SI/HI.  His PHQ-9 and GAD-7 are both zeros today.  Keep scheduled appointment in August.

## 2022-12-25 NOTE — Assessment & Plan Note (Signed)
Chronic, stable.  Continue Lamictal 50 mg daily.  He is continuing to follow with psychiatry and a therapist.  He is going to return to work on 01/02/2023.  He denies SI/HI.  His PHQ-9 and GAD-7 are both zeros today.  Keep scheduled appointment in August. 

## 2022-12-25 NOTE — Patient Instructions (Signed)
It was great to see you!  Keep appointment with psychiatry and your therapist and taking your medications as prescribed.   Let's follow-up at your next scheduled appointment in August.   Take care,  Rodman Pickle, NP

## 2023-03-27 ENCOUNTER — Encounter (INDEPENDENT_AMBULATORY_CARE_PROVIDER_SITE_OTHER): Payer: Self-pay

## 2023-04-15 ENCOUNTER — Ambulatory Visit (INDEPENDENT_AMBULATORY_CARE_PROVIDER_SITE_OTHER): Payer: Commercial Managed Care - PPO | Admitting: Nurse Practitioner

## 2023-04-15 ENCOUNTER — Encounter: Payer: Self-pay | Admitting: Nurse Practitioner

## 2023-04-15 VITALS — BP 124/76 | HR 80 | Temp 97.0°F | Ht 69.75 in | Wt 199.0 lb

## 2023-04-15 DIAGNOSIS — E785 Hyperlipidemia, unspecified: Secondary | ICD-10-CM | POA: Diagnosis not present

## 2023-04-15 DIAGNOSIS — R7303 Prediabetes: Secondary | ICD-10-CM | POA: Diagnosis not present

## 2023-04-15 DIAGNOSIS — F3181 Bipolar II disorder: Secondary | ICD-10-CM | POA: Diagnosis not present

## 2023-04-15 DIAGNOSIS — F411 Generalized anxiety disorder: Secondary | ICD-10-CM

## 2023-04-15 DIAGNOSIS — Z0000 Encounter for general adult medical examination without abnormal findings: Secondary | ICD-10-CM

## 2023-04-15 DIAGNOSIS — M25561 Pain in right knee: Secondary | ICD-10-CM

## 2023-04-15 LAB — CBC WITH DIFFERENTIAL/PLATELET
Basophils Absolute: 0.1 10*3/uL (ref 0.0–0.1)
Basophils Relative: 0.6 % (ref 0.0–3.0)
Eosinophils Absolute: 0.3 10*3/uL (ref 0.0–0.7)
Eosinophils Relative: 2.5 % (ref 0.0–5.0)
HCT: 45.7 % (ref 39.0–52.0)
Hemoglobin: 14.7 g/dL (ref 13.0–17.0)
Lymphocytes Relative: 23.6 % (ref 12.0–46.0)
Lymphs Abs: 2.9 10*3/uL (ref 0.7–4.0)
MCHC: 32.1 g/dL (ref 30.0–36.0)
MCV: 86.2 fl (ref 78.0–100.0)
Monocytes Absolute: 0.8 10*3/uL (ref 0.1–1.0)
Monocytes Relative: 6.4 % (ref 3.0–12.0)
Neutro Abs: 8.1 10*3/uL — ABNORMAL HIGH (ref 1.4–7.7)
Neutrophils Relative %: 66.9 % (ref 43.0–77.0)
Platelets: 249 10*3/uL (ref 150.0–400.0)
RBC: 5.3 Mil/uL (ref 4.22–5.81)
RDW: 15.3 % (ref 11.5–15.5)
WBC: 12.2 10*3/uL — ABNORMAL HIGH (ref 4.0–10.5)

## 2023-04-15 LAB — HEMOGLOBIN A1C: Hgb A1c MFr Bld: 6.2 % (ref 4.6–6.5)

## 2023-04-15 LAB — LIPID PANEL
Cholesterol: 222 mg/dL — ABNORMAL HIGH (ref 0–200)
HDL: 51.1 mg/dL (ref >39.00)
NonHDL: 170.48
Total CHOL/HDL Ratio: 4
Triglycerides: 221 mg/dL — ABNORMAL HIGH (ref 0.0–149.0)
VLDL: 44.2 mg/dL — ABNORMAL HIGH (ref 0.0–40.0)

## 2023-04-15 LAB — COMPREHENSIVE METABOLIC PANEL
ALT: 28 U/L (ref 0–53)
AST: 21 U/L (ref 0–37)
Albumin: 4.8 g/dL (ref 3.5–5.2)
Alkaline Phosphatase: 96 U/L (ref 39–117)
BUN: 11 mg/dL (ref 6–23)
CO2: 26 mEq/L (ref 19–32)
Calcium: 9.8 mg/dL (ref 8.4–10.5)
Chloride: 106 mEq/L (ref 96–112)
Creatinine, Ser: 1.14 mg/dL (ref 0.40–1.50)
GFR: 79.44 mL/min (ref >60.00)
Glucose, Bld: 98 mg/dL (ref 70–99)
Potassium: 4.1 mEq/L (ref 3.5–5.1)
Sodium: 145 mEq/L (ref 135–145)
Total Bilirubin: 0.3 mg/dL (ref 0.2–1.2)
Total Protein: 7.8 g/dL (ref 6.0–8.3)

## 2023-04-15 LAB — LDL CHOLESTEROL, DIRECT: Direct LDL: 156 mg/dL

## 2023-04-15 NOTE — Assessment & Plan Note (Signed)
Check A1c today.

## 2023-04-15 NOTE — Patient Instructions (Signed)
It was great to see you!  We are checking your labs today and will let you know the results via mychart/phone.   Keep taking ibuprofen and using ice as needed for your knee. Wear a compression sleeve during the day, take it off at night.   Let's follow-up in 1 year, sooner if you have concerns.  If a referral was placed today, you will be contacted for an appointment. Please note that routine referrals can sometimes take up to 3-4 weeks to process. Please call our office if you haven't heard anything after this time frame.  Take care,  Rodman Pickle, NP

## 2023-04-15 NOTE — Assessment & Plan Note (Signed)
Chronic, stable.  He stopped following with psychiatry and stopped the lamictal. He states that his symptoms have improved and believe it was just situational. He denies SI/HI. Follow-up with any concerns.

## 2023-04-15 NOTE — Assessment & Plan Note (Signed)
Health maintenance reviewed and updated. Discussed nutrition, exercise. Check CMP, CBC today. Follow-up 1 year.   

## 2023-04-15 NOTE — Assessment & Plan Note (Signed)
Chronic, stable. Check CMP, CBC, lipid panel and treat based on results.

## 2023-04-15 NOTE — Progress Notes (Signed)
BP 124/76 (BP Location: Left Arm)   Pulse 80   Temp (!) 97 F (36.1 C)   Ht 5' 9.75" (1.772 m)   Wt 199 lb (90.3 kg)   SpO2 97%   BMI 28.76 kg/m    Subjective:    Patient ID: Gregory Anthony, male    DOB: [birth year], 42 y.o.   MRN: [id number]  CC: Chief Complaint  Patient presents with   Annual Exam    With lab work-patient is not fasting, concerns with right knee pain    HPI: Gregory Anthony is a 42 y.o. male presenting on 04/15/2023 for comprehensive medical examination. Current medical complaints include: right knee pain  He was at work 2 weeks ago, climbing a ladder when he felt his right knee pop. Since then, he has just been having pain when he climbs the ladder. The pain does not radiate. There was swelling at first, but that has gone away. He has been wearing a knee brace, using ice, and taking ibuprofen.   He currently lives with: son  Depression and Anxiety Screen done today and results listed below:     04/15/2023    8:23 AM 12/25/2022    8:44 AM 11/20/2022   11:48 AM 04/11/2022   10:49 AM 12/14/2020    9:16 AM  Depression screen PHQ 2/9  Decreased Interest 0 0 0 0 0  Down, Depressed, Hopeless 0 0 3 0 0  PHQ - 2 Score 0 0 3 0 0  Altered sleeping 0 0 3  0  Tired, decreased energy 0 0 3  0  Change in appetite 0 0 0  0  Feeling bad or failure about yourself  0 0 1  0  Trouble concentrating 0 0 0  0  Moving slowly or fidgety/restless 0 0 0  0  Suicidal thoughts 0 0 0  0  PHQ-9 Score 0 0 10  0  Difficult doing work/chores Not difficult at all Not difficult at all Very difficult        04/15/2023    8:23 AM 12/25/2022    8:45 AM 11/20/2022   11:48 AM 12/14/2020    9:16 AM  GAD 7 : Generalized Anxiety Score  Nervous, Anxious, on Edge 0 0 1 0  Control/stop worrying 0 0 1 0  Worry too much - different things 0 0 1 0  Trouble relaxing 0 0 1 0  Restless 0 0 3 0  Easily annoyed or irritable 0 0 3 0  Afraid - awful might happen 0 0 0 0  Total GAD 7 Score 0 0 10 0   Anxiety Difficulty Not difficult at all Not difficult at all      The patient does not have a history of falls. I did not complete a risk assessment for falls. A plan of care for falls was not documented.   Past Medical History:  Past Medical History:  Diagnosis Date   Hyperlipidemia     Surgical History:  Past Surgical History:  Procedure Laterality Date   WISDOM TOOTH EXTRACTION      Medications:  Current Outpatient Medications on File Prior to Visit  Medication Sig   hydrOXYzine (ATARAX) 10 MG tablet Take 10-20 mg by mouth daily as needed. (Patient not taking: Reported on 04/15/2023)   No current facility-administered medications on file prior to visit.    Allergies:  No Known Allergies  Social History:  Social History   Socioeconomic History   Marital status: Single  Spouse name: Not on file   Number of children: Not on file   Years of education: Not on file   Highest education level: 12th grade  Occupational History   Not on file  Tobacco Use   Smoking status: Former    Current packs/day: 0.00    Average packs/day: 0.3 packs/day for 22.0 years (5.5 ttl pk-yrs)    Types: Cigarettes    Start date: 11/25/1996    Quit date: 11/26/2018    Years since quitting: 4.3   Smokeless tobacco: Never  Vaping Use   Vaping status: Never Used  Substance and Sexual Activity   Alcohol use: Not Currently    Alcohol/week: 1.0 standard drink of alcohol    Types: 1 Standard drinks or equivalent per week    Comment: occ   Drug use: Yes    Types: Marijuana   Sexual activity: Yes    Birth control/protection: Condom  Other Topics Concern   Not on file  Social History Narrative   Not on file   Social Determinants of Health   Financial Resource Strain: Low Risk  (11/19/2022)   Overall Financial Resource Strain (CARDIA)    Difficulty of Paying Living Expenses: Not hard at all  Food Insecurity: No Food Insecurity (11/19/2022)   Hunger Vital Sign    Worried About Running Out  of Food in the Last Year: Never true    Ran Out of Food in the Last Year: Never true  Transportation Needs: No Transportation Needs (11/19/2022)   PRAPARE - Administrator, Civil Service (Medical): No    Lack of Transportation (Non-Medical): No  Physical Activity: Sufficiently Active (11/19/2022)   Exercise Vital Sign    Days of Exercise per Week: 3 days    Minutes of Exercise per Session: 60 min  Stress: Stress Concern Present (11/19/2022)   [employer] of Occupational Health - Occupational Stress Questionnaire    Feeling of Stress : Very much  Social Connections: Unknown (11/19/2022)   Social Connection and Isolation Panel [NHANES]    Frequency of Communication with Friends and Family: Twice a week    Frequency of Social Gatherings with Friends and Family: Never    Attends Religious Services: Patient declined    Database administrator or Organizations: No    Attends Engineer, structural: Not on file    Marital Status: Living with partner  Intimate Partner Violence: Not on file   Social History   Tobacco Use  Smoking Status Former   Current packs/day: 0.00   Average packs/day: 0.3 packs/day for 22.0 years (5.5 ttl pk-yrs)   Types: Cigarettes   Start date: 11/25/1996   Quit date: 11/26/2018   Years since quitting: 4.3  Smokeless Tobacco Never   Social History   Substance and Sexual Activity  Alcohol Use Not Currently   Alcohol/week: 1.0 standard drink of alcohol   Types: 1 Standard drinks or equivalent per week   Comment: occ    Family History:  Family History  Problem Relation Age of Onset   Diabetes Maternal Grandmother     Past medical history, surgical history, medications, allergies, family history and social history reviewed with patient today and changes made to appropriate areas of the chart.   Review of Systems  Constitutional: Negative.   HENT: Negative.    Respiratory: Negative.    Cardiovascular: Negative.   Gastrointestinal:   Positive for diarrhea (at times). Negative for abdominal pain, blood in stool and constipation.  Genitourinary: Negative.  Musculoskeletal:  Positive for joint pain (right knee).  Skin: Negative.   Neurological: Negative.   Psychiatric/Behavioral: Negative.     All other ROS negative except what is listed above and in the HPI.      Objective:    BP 124/76 (BP Location: Left Arm)   Pulse 80   Temp (!) 97 F (36.1 C)   Ht 5' 9.75" (1.772 m)   Wt 199 lb (90.3 kg)   SpO2 97%   BMI 28.76 kg/m   Wt Readings from Last 3 Encounters:  04/15/23 199 lb (90.3 kg)  12/25/22 190 lb (86.2 kg)  11/20/22 189 lb 6.4 oz (85.9 kg)    Physical Exam Vitals and nursing note reviewed.  Constitutional:      General: He is not in acute distress.    Appearance: Normal appearance.  HENT:     Head: Normocephalic and atraumatic.     Right Ear: Tympanic membrane, ear canal and external ear normal.     Left Ear: Tympanic membrane, ear canal and external ear normal.  Eyes:     Conjunctiva/sclera: Conjunctivae normal.  Cardiovascular:     Rate and Rhythm: Normal rate and regular rhythm.     Pulses: Normal pulses.     Heart sounds: Normal heart sounds.  Pulmonary:     Effort: Pulmonary effort is normal.     Breath sounds: Normal breath sounds.  Abdominal:     Palpations: Abdomen is soft.     Tenderness: There is no abdominal tenderness.  Musculoskeletal:        General: No swelling or tenderness. Normal range of motion.     Cervical back: Normal range of motion and neck supple. No tenderness.     Right lower leg: No edema.     Left lower leg: No edema.     Comments: Negative anterior/posterior drawer  Lymphadenopathy:     Cervical: No cervical adenopathy.  Skin:    General: Skin is warm and dry.  Neurological:     General: No focal deficit present.     Mental Status: He is alert and oriented to person, place, and time.     Cranial Nerves: No cranial nerve deficit.     Coordination:  Coordination normal.     Gait: Gait normal.  Psychiatric:        Mood and Affect: Mood normal.        Behavior: Behavior normal.        Thought Content: Thought content normal.        Judgment: Judgment normal.     Results for orders placed or performed in visit on 04/11/22  CBC  Result Value Ref Range   WBC 10.0 4.0 - 10.5 K/uL   RBC 5.01 4.22 - 5.81 Mil/uL   Platelets 237.0 150.0 - 400.0 K/uL   Hemoglobin 13.8 13.0 - 17.0 g/dL   HCT 25.9 56.3 - 87.5 %   MCV 85.1 78.0 - 100.0 fl   MCHC 32.5 30.0 - 36.0 g/dL   RDW 64.3 32.9 - 51.8 %  Comprehensive metabolic panel  Result Value Ref Range   Sodium 140 135 - 145 mEq/L   Potassium 4.2 3.5 - 5.1 mEq/L   Chloride 102 96 - 112 mEq/L   CO2 32 19 - 32 mEq/L   Glucose, Bld 92 70 - 99 mg/dL   BUN 17 6 - 23 mg/dL   Creatinine, Ser 8.41 0.40 - 1.50 mg/dL   Total Bilirubin 0.2 0.2 - 1.2 mg/dL  Alkaline Phosphatase 66 39 - 117 U/L   AST 21 0 - 37 U/L   ALT 20 0 - 53 U/L   Total Protein 7.5 6.0 - 8.3 g/dL   Albumin 4.5 3.5 - 5.2 g/dL   GFR 84.69 >62.95 mL/min   Calcium 9.4 8.4 - 10.5 mg/dL  Lipid panel  Result Value Ref Range   Cholesterol 155 0 - 200 mg/dL   Triglycerides 284.1 0.0 - 149.0 mg/dL   HDL 32.44 >01.02 mg/dL   VLDL 72.5 0.0 - 36.6 mg/dL   LDL Cholesterol 82 0 - 99 mg/dL   Total CHOL/HDL Ratio 3    NonHDL 106.59   Hemoglobin A1c  Result Value Ref Range   Hgb A1c MFr Bld 6.3 4.6 - 6.5 %      Assessment & Plan:   Problem List Items Addressed This Visit       Other   Hyperlipidemia    Chronic, stable. Check CMP, CBC, lipid panel and treat based on results.        Relevant Orders   CBC with Differential/Platelet   Comprehensive metabolic panel   Lipid panel   Generalized anxiety disorder    Chronic, stable.  He stopped following with psychiatry and stopped the lamictal. He states that his symptoms have improved and believe it was just situational. He denies SI/HI. Follow-up with any concerns.        Bipolar 2 disorder (HCC)    Chronic, stable.  He stopped following with psychiatry and stopped the lamictal. He states that his symptoms have improved and believe it was just situational. He denies SI/HI. Follow-up with any concerns.       Routine general medical examination at a health care facility - Primary    Health maintenance reviewed and updated. Discussed nutrition, exercise. Check CMP, CBC today. Follow-up 1 year.        Prediabetes    Check A1c today.       Relevant Orders   Hemoglobin A1c   Other Visit Diagnoses     Acute pain of right knee       Exam WNL today. Continue wearing the knee brace during the day, taking ibuprofen prn pain. Follow-up if symptoms worsen or don't improve.        IMMUNIZATIONS:   - Tdap: Tetanus vaccination status reviewed: last tetanus booster within 10 years. - Influenza: Postponed to flu season - Pneumovax: Not applicable - Prevnar: Not applicable - HPV: Not applicable - Shingrix vaccine: Not applicable  SCREENING: - Colonoscopy: Not applicable  Discussed with patient purpose of the colonoscopy is to detect colon cancer at curable precancerous or early stages   - AAA Screening: Not applicable   PATIENT COUNSELING:    Sexuality: Discussed sexually transmitted diseases, partner selection, use of condoms, avoidance of unintended pregnancy  and contraceptive alternatives.   Advised to avoid cigarette smoking.  I discussed with the patient that most people either abstain from alcohol or drink within safe limits (<=14/week and <=4 drinks/occasion for males, <=7/weeks and <= 3 drinks/occasion for females) and that the risk for alcohol disorders and other health effects rises proportionally with the number of drinks per week and how often a drinker exceeds daily limits.  Discussed cessation/primary prevention of drug use and availability of treatment for abuse.   Diet: Encouraged to adjust caloric intake to maintain  or achieve ideal  body weight, to reduce intake of dietary saturated fat and total fat, to limit sodium intake by avoiding high  sodium foods and not adding table salt, and to maintain adequate dietary potassium and calcium preferably from fresh fruits, vegetables, and low-fat dairy products.    stressed the importance of regular exercise  Injury prevention: Discussed safety belts, safety helmets, smoke detector, smoking near bedding or upholstery.   Dental health: Discussed importance of regular tooth brushing, flossing, and dental visits.   Follow up plan: NEXT PREVENTATIVE PHYSICAL DUE IN 1 YEAR. Return in about 1 year (around 04/14/2024) for CPE.  Niralya Ohanian A Salvatore Shear

## 2023-06-10 ENCOUNTER — Ambulatory Visit: Payer: Commercial Managed Care - PPO

## 2023-06-10 ENCOUNTER — Ambulatory Visit (HOSPITAL_COMMUNITY): Payer: Commercial Managed Care - PPO

## 2024-04-17 ENCOUNTER — Encounter: Payer: Self-pay | Admitting: Nurse Practitioner

## 2024-04-17 ENCOUNTER — Ambulatory Visit: Payer: Self-pay | Admitting: Nurse Practitioner

## 2024-04-17 ENCOUNTER — Ambulatory Visit (INDEPENDENT_AMBULATORY_CARE_PROVIDER_SITE_OTHER): Payer: Commercial Managed Care - PPO | Admitting: Nurse Practitioner

## 2024-04-17 VITALS — BP 128/88 | HR 58 | Temp 97.2°F | Ht 69.75 in | Wt 214.2 lb

## 2024-04-17 DIAGNOSIS — E785 Hyperlipidemia, unspecified: Secondary | ICD-10-CM | POA: Diagnosis not present

## 2024-04-17 DIAGNOSIS — F3181 Bipolar II disorder: Secondary | ICD-10-CM

## 2024-04-17 DIAGNOSIS — Z0000 Encounter for general adult medical examination without abnormal findings: Secondary | ICD-10-CM

## 2024-04-17 DIAGNOSIS — G4700 Insomnia, unspecified: Secondary | ICD-10-CM

## 2024-04-17 DIAGNOSIS — R7303 Prediabetes: Secondary | ICD-10-CM

## 2024-04-17 DIAGNOSIS — F411 Generalized anxiety disorder: Secondary | ICD-10-CM

## 2024-04-17 LAB — CBC WITH DIFFERENTIAL/PLATELET
Basophils Absolute: 0 K/uL (ref 0.0–0.1)
Basophils Relative: 0.4 % (ref 0.0–3.0)
Eosinophils Absolute: 0.3 K/uL (ref 0.0–0.7)
Eosinophils Relative: 2.7 % (ref 0.0–5.0)
HCT: 42.9 % (ref 39.0–52.0)
Hemoglobin: 14.1 g/dL (ref 13.0–17.0)
Lymphocytes Relative: 25.7 % (ref 12.0–46.0)
Lymphs Abs: 2.6 K/uL (ref 0.7–4.0)
MCHC: 32.9 g/dL (ref 30.0–36.0)
MCV: 84.5 fl (ref 78.0–100.0)
Monocytes Absolute: 0.6 K/uL (ref 0.1–1.0)
Monocytes Relative: 6.2 % (ref 3.0–12.0)
Neutro Abs: 6.7 K/uL (ref 1.4–7.7)
Neutrophils Relative %: 65 % (ref 43.0–77.0)
Platelets: 215 K/uL (ref 150.0–400.0)
RBC: 5.08 Mil/uL (ref 4.22–5.81)
RDW: 15.1 % (ref 11.5–15.5)
WBC: 10.3 K/uL (ref 4.0–10.5)

## 2024-04-17 LAB — COMPREHENSIVE METABOLIC PANEL WITH GFR
ALT: 29 U/L (ref 0–53)
AST: 20 U/L (ref 0–37)
Albumin: 4.5 g/dL (ref 3.5–5.2)
Alkaline Phosphatase: 79 U/L (ref 39–117)
BUN: 14 mg/dL (ref 6–23)
CO2: 29 meq/L (ref 19–32)
Calcium: 9.1 mg/dL (ref 8.4–10.5)
Chloride: 104 meq/L (ref 96–112)
Creatinine, Ser: 1.04 mg/dL (ref 0.40–1.50)
GFR: 88.07 mL/min
Glucose, Bld: 84 mg/dL (ref 70–99)
Potassium: 4.2 meq/L (ref 3.5–5.1)
Sodium: 140 meq/L (ref 135–145)
Total Bilirubin: 0.3 mg/dL (ref 0.2–1.2)
Total Protein: 7.4 g/dL (ref 6.0–8.3)

## 2024-04-17 LAB — LIPID PANEL
Cholesterol: 171 mg/dL (ref 0–200)
HDL: 46.1 mg/dL (ref >39.00)
LDL Cholesterol: 98 mg/dL (ref 0–99)
NonHDL: 125.02
Total CHOL/HDL Ratio: 4
Triglycerides: 135 mg/dL (ref 0.0–149.0)
VLDL: 27 mg/dL (ref 0.0–40.0)

## 2024-04-17 LAB — HEMOGLOBIN A1C: Hgb A1c MFr Bld: 6.5 % (ref 4.6–6.5)

## 2024-04-17 NOTE — Assessment & Plan Note (Signed)
 Chronic, stable.  He states that his mood is doing well and denies concerns. Follow-up in 1 year or sooner with concerns.

## 2024-04-17 NOTE — Progress Notes (Signed)
 BP 128/88 (BP Location: Left Arm, Patient Position: Sitting, Cuff Size: Normal)   Pulse (!) 58   Temp (!) 97.2 F (36.2 C)   Ht 5' 9.75 (1.772 m)   Wt 214 lb 3.2 oz (97.2 kg)   SpO2 98%   BMI 30.96 kg/m    Subjective:    Patient ID: Gregory Anthony, male    DOB: 22-Mar-1981, 43 y.o.   MRN: [id number]  CC: Chief Complaint  Patient presents with   Annual Exam    With labs-patient is not fasting, no concerns    HPI: Gregory Anthony is a 43 y.o. male presenting on 04/17/2024 for comprehensive medical examination. Current medical complaints include:none  He currently lives with: son  Depression and Anxiety Screen done today and results listed below:     04/17/2024    8:46 AM 04/15/2023    8:23 AM 12/25/2022    8:44 AM 11/20/2022   11:48 AM 04/11/2022   10:49 AM  Depression screen PHQ 2/9  Decreased Interest 0 0 0 0 0  Down, Depressed, Hopeless 0 0 0 3 0  PHQ - 2 Score 0 0 0 3 0  Altered sleeping 0 0 0 3   Tired, decreased energy 0 0 0 3   Change in appetite 0 0 0 0   Feeling bad or failure about yourself  0 0 0 1   Trouble concentrating 0 0 0 0   Moving slowly or fidgety/restless 0 0 0 0   Suicidal thoughts 0 0 0 0   PHQ-9 Score 0 0 0 10   Difficult doing work/chores Not difficult at all Not difficult at all Not difficult at all Very difficult       04/17/2024    8:46 AM 04/15/2023    8:23 AM 12/25/2022    8:45 AM 11/20/2022   11:48 AM  GAD 7 : Generalized Anxiety Score  Nervous, Anxious, on Edge 0 0 0 1  Control/stop worrying 0 0 0 1  Worry too much - different things 0 0 0 1  Trouble relaxing 0 0 0 1  Restless 0 0 0 3  Easily annoyed or irritable 0 0 0 3  Afraid - awful might happen 0 0 0 0  Total GAD 7 Score 0 0 0 10  Anxiety Difficulty Not difficult at all Not difficult at all Not difficult at all     The patient does not have a history of falls. I did not complete a risk assessment for falls. A plan of care for falls was not documented.   Past Medical History:   Past Medical History:  Diagnosis Date   Hyperlipidemia     Surgical History:  Past Surgical History:  Procedure Laterality Date   WISDOM TOOTH EXTRACTION      Medications:  No current outpatient medications on file prior to visit.   No current facility-administered medications on file prior to visit.    Allergies:  No Known Allergies  Social History:  Social History   Socioeconomic History   Marital status: Single    Spouse name: Not on file   Number of children: Not on file   Years of education: Not on file   Highest education level: 12th grade  Occupational History   Not on file  Tobacco Use   Smoking status: Former    Current packs/day: 0.00    Average packs/day: 0.3 packs/day for 22.0 years (5.5 ttl pk-yrs)    Types: Cigarettes    Start  date: 11/25/1996    Quit date: 11/26/2018    Years since quitting: 5.3   Smokeless tobacco: Never  Vaping Use   Vaping status: Never Used  Substance and Sexual Activity   Alcohol use: Not Currently    Alcohol/week: 1.0 standard drink of alcohol    Types: 1 Standard drinks or equivalent per week    Comment: occ   Drug use: Yes    Types: Marijuana   Sexual activity: Yes    Birth control/protection: Condom  Other Topics Concern   Not on file  Social History Narrative   Not on file   Social Drivers of Health   Financial Resource Strain: Low Risk  (04/16/2024)   Overall Financial Resource Strain (CARDIA)    Difficulty of Paying Living Expenses: Not hard at all  Food Insecurity: No Food Insecurity (04/16/2024)   Hunger Vital Sign    Worried About Running Out of Food in the Last Year: Never true    Ran Out of Food in the Last Year: Never true  Transportation Needs: No Transportation Needs (04/16/2024)   PRAPARE - Administrator, Civil Service (Medical): No    Lack of Transportation (Non-Medical): No  Physical Activity: Sufficiently Active (04/16/2024)   Exercise Vital Sign    Days of Exercise per Week: 5 days     Minutes of Exercise per Session: 100 min  Stress: No Stress Concern Present (04/16/2024)   [employer] of Occupational Health - Occupational Stress Questionnaire    Feeling of Stress: Not at all  Social Connections: Moderately Isolated (04/16/2024)   Social Connection and Isolation Panel    Frequency of Communication with Friends and Family: Twice a week    Frequency of Social Gatherings with Friends and Family: Twice a week    Attends Religious Services: Patient declined    Database administrator or Organizations: No    Attends Engineer, structural: Not on file    Marital Status: Living with partner  Intimate Partner Violence: Not on file   Social History   Tobacco Use  Smoking Status Former   Current packs/day: 0.00   Average packs/day: 0.3 packs/day for 22.0 years (5.5 ttl pk-yrs)   Types: Cigarettes   Start date: 11/25/1996   Quit date: 11/26/2018   Years since quitting: 5.3  Smokeless Tobacco Never   Social History   Substance and Sexual Activity  Alcohol Use Not Currently   Alcohol/week: 1.0 standard drink of alcohol   Types: 1 Standard drinks or equivalent per week   Comment: occ    Family History:  Family History  Problem Relation Age of Onset   Diabetes Maternal Grandmother     Past medical history, surgical history, medications, allergies, family history and social history reviewed with patient today and changes made to appropriate areas of the chart.   Review of Systems  Constitutional: Negative.   HENT: Negative.    Eyes: Negative.   Respiratory: Negative.    Cardiovascular: Negative.   Gastrointestinal: Negative.   Genitourinary: Negative.   Musculoskeletal: Negative.   Skin: Negative.   Neurological: Negative.   Psychiatric/Behavioral: Negative.     All other ROS negative except what is listed above and in the HPI.      Objective:    BP 128/88 (BP Location: Left Arm, Patient Position: Sitting, Cuff Size: Normal)   Pulse (!) 58    Temp (!) 97.2 F (36.2 C)   Ht 5' 9.75 (1.772 m)   Wt 214  lb 3.2 oz (97.2 kg)   SpO2 98%   BMI 30.96 kg/m   Wt Readings from Last 3 Encounters:  04/17/24 214 lb 3.2 oz (97.2 kg)  04/15/23 199 lb (90.3 kg)  12/25/22 190 lb (86.2 kg)    Physical Exam Vitals and nursing note reviewed.  Constitutional:      General: He is not in acute distress.    Appearance: Normal appearance.  HENT:     Head: Normocephalic and atraumatic.     Right Ear: Tympanic membrane, ear canal and external ear normal.     Left Ear: Tympanic membrane, ear canal and external ear normal.     Mouth/Throat:     Mouth: Mucous membranes are moist.     Pharynx: No posterior oropharyngeal erythema.  Eyes:     Conjunctiva/sclera: Conjunctivae normal.  Cardiovascular:     Rate and Rhythm: Normal rate and regular rhythm.     Pulses: Normal pulses.     Heart sounds: Normal heart sounds.  Pulmonary:     Effort: Pulmonary effort is normal.     Breath sounds: Normal breath sounds.  Abdominal:     Palpations: Abdomen is soft.     Tenderness: There is no abdominal tenderness.  Musculoskeletal:        General: Normal range of motion.     Cervical back: Normal range of motion and neck supple. No tenderness.     Right lower leg: No edema.     Left lower leg: No edema.  Lymphadenopathy:     Cervical: No cervical adenopathy.  Skin:    General: Skin is warm and dry.  Neurological:     General: No focal deficit present.     Mental Status: He is alert and oriented to person, place, and time.     Cranial Nerves: No cranial nerve deficit.     Coordination: Coordination normal.     Gait: Gait normal.  Psychiatric:        Mood and Affect: Mood normal.        Behavior: Behavior normal.        Thought Content: Thought content normal.        Judgment: Judgment normal.     Results for orders placed or performed in visit on 04/15/23  CBC with Differential/Platelet   Collection Time: 04/15/23  8:58 AM  Result Value Ref  Range   WBC 12.2 (H) 4.0 - 10.5 K/uL   RBC 5.30 4.22 - 5.81 Mil/uL   Hemoglobin 14.7 13.0 - 17.0 g/dL   HCT 54.2 60.9 - 47.9 %   MCV 86.2 78.0 - 100.0 fl   MCHC 32.1 30.0 - 36.0 g/dL   RDW 84.6 88.4 - 84.4 %   Platelets 249.0 150.0 - 400.0 K/uL   Neutrophils Relative % 66.9 43.0 - 77.0 %   Lymphocytes Relative 23.6 12.0 - 46.0 %   Monocytes Relative 6.4 3.0 - 12.0 %   Eosinophils Relative 2.5 0.0 - 5.0 %   Basophils Relative 0.6 0.0 - 3.0 %   Neutro Abs 8.1 (H) 1.4 - 7.7 K/uL   Lymphs Abs 2.9 0.7 - 4.0 K/uL   Monocytes Absolute 0.8 0.1 - 1.0 K/uL   Eosinophils Absolute 0.3 0.0 - 0.7 K/uL   Basophils Absolute 0.1 0.0 - 0.1 K/uL  Comprehensive metabolic panel   Collection Time: 04/15/23  8:58 AM  Result Value Ref Range   Sodium 145 135 - 145 mEq/L   Potassium 4.1 3.5 - 5.1 mEq/L  Chloride 106 96 - 112 mEq/L   CO2 26 19 - 32 mEq/L   Glucose, Bld 98 70 - 99 mg/dL   BUN 11 6 - 23 mg/dL   Creatinine, Ser 8.85 0.40 - 1.50 mg/dL   Total Bilirubin 0.3 0.2 - 1.2 mg/dL   Alkaline Phosphatase 96 39 - 117 U/L   AST 21 0 - 37 U/L   ALT 28 0 - 53 U/L   Total Protein 7.8 6.0 - 8.3 g/dL   Albumin 4.8 3.5 - 5.2 g/dL   GFR 20.55 >39.99 mL/min   Calcium 9.8 8.4 - 10.5 mg/dL  Hemoglobin J8r   Collection Time: 04/15/23  8:58 AM  Result Value Ref Range   Hgb A1c MFr Bld 6.2 4.6 - 6.5 %  Lipid panel   Collection Time: 04/15/23  8:58 AM  Result Value Ref Range   Cholesterol 222 (H) 0 - 200 mg/dL   Triglycerides 778.9 (H) 0.0 - 149.0 mg/dL   HDL 48.89 >60.99 mg/dL   VLDL 55.7 (H) 0.0 - 59.9 mg/dL   Total CHOL/HDL Ratio 4    NonHDL 170.48   LDL cholesterol, direct   Collection Time: 04/15/23  8:58 AM  Result Value Ref Range   Direct LDL 156.0 mg/dL      Assessment & Plan:   Problem List Items Addressed This Visit       Other   Hyperlipidemia   Chronic, stable. Check CMP, CBC, lipid panel and treat based on results.        Relevant Orders   CBC with Differential/Platelet    Comprehensive metabolic panel with GFR   Lipid panel   Generalized anxiety disorder   Chronic, stable.  He states that his mood is doing well and denies concerns. Follow-up in 1 year or sooner with concerns.       Bipolar 2 disorder (HCC)   Chronic, stable.  He states that his mood is doing well and denies concerns. Follow-up in 1 year or sooner with concerns.       Insomnia   Chronic, stable.  He states that his mood is doing well and denies concerns. Follow-up in 1 year or sooner with concerns.       Routine general medical examination at a health care facility - Primary   Health maintenance reviewed and updated. Discussed nutrition, exercise. Follow-up 1 year.        Prediabetes   Check A1c today.       Relevant Orders   Hemoglobin A1c    IMMUNIZATIONS:   - Tdap: Tetanus vaccination status reviewed: last tetanus booster within 10 years. - Influenza: Postponed to flu season - Pneumovax: Not applicable - Prevnar: Not applicable - HPV: Not applicable - Shingrix vaccine: Not applicable  SCREENING: - Colonoscopy: Not applicable  Discussed with patient purpose of the colonoscopy is to detect colon cancer at curable precancerous or early stages   - AAA Screening: Not applicable   PATIENT COUNSELING:    Sexuality: Discussed sexually transmitted diseases, partner selection, use of condoms, avoidance of unintended pregnancy  and contraceptive alternatives.   Advised to avoid cigarette smoking.  I discussed with the patient that most people either abstain from alcohol or drink within safe limits (<=14/week and <=4 drinks/occasion for males, <=7/weeks and <= 3 drinks/occasion for females) and that the risk for alcohol disorders and other health effects rises proportionally with the number of drinks per week and how often a drinker exceeds daily limits.  Discussed cessation/primary prevention of  drug use and availability of treatment for abuse.   Diet: Encouraged to adjust  caloric intake to maintain  or achieve ideal body weight, to reduce intake of dietary saturated fat and total fat, to limit sodium intake by avoiding high sodium foods and not adding table salt, and to maintain adequate dietary potassium and calcium preferably from fresh fruits, vegetables, and low-fat dairy products.    stressed the importance of regular exercise  Injury prevention: Discussed safety belts, safety helmets, smoke detector, smoking near bedding or upholstery.   Dental health: Discussed importance of regular tooth brushing, flossing, and dental visits.   Follow up plan: NEXT PREVENTATIVE PHYSICAL DUE IN 1 YEAR. Return in about 1 year (around 04/17/2025) for CPE.  Torii Royse A Daryn Pisani

## 2024-04-17 NOTE — Assessment & Plan Note (Signed)
 Check A1c today.

## 2024-04-17 NOTE — Patient Instructions (Signed)
It was great to see you!  We are checking your labs today and will let you know the results via mychart/phone.   Keep up the great work!   Let's follow-up in 1 year, sooner if you have concerns.  If a referral was placed today, you will be contacted for an appointment. Please note that routine referrals can sometimes take up to 3-4 weeks to process. Please call our office if you haven't heard anything after this time frame.  Take care,  Nivin Braniff, NP  

## 2024-04-17 NOTE — Assessment & Plan Note (Signed)
 Health maintenance reviewed and updated. Discussed nutrition, exercise. Follow-up 1 year.

## 2024-04-17 NOTE — Assessment & Plan Note (Signed)
Chronic, stable. Check CMP, CBC, lipid panel and treat based on results.

## 2024-07-03 ENCOUNTER — Encounter: Payer: Self-pay | Admitting: Nurse Practitioner

## 2024-07-13 ENCOUNTER — Ambulatory Visit (INDEPENDENT_AMBULATORY_CARE_PROVIDER_SITE_OTHER): Admitting: Nurse Practitioner

## 2024-07-13 VITALS — BP 124/82 | HR 65 | Temp 97.6°F | Ht 69.75 in | Wt 216.0 lb

## 2024-07-13 DIAGNOSIS — R7303 Prediabetes: Secondary | ICD-10-CM | POA: Diagnosis not present

## 2024-07-13 LAB — POCT GLYCOSYLATED HEMOGLOBIN (HGB A1C)
HbA1c POC (<> result, manual entry): 6 % (ref 4.0–5.6)
HbA1c, POC (controlled diabetic range): 6 % (ref 0.0–7.0)
HbA1c, POC (prediabetic range): 6 % (ref 5.7–6.4)
Hemoglobin A1C: 6 % — AB (ref 4.0–5.6)

## 2024-07-13 NOTE — Progress Notes (Signed)
 Established Patient Office Visit  Subjective   Patient ID: Gregory Anthony, male    DOB: 05/02/1981  Age: 43 y.o. MRN: [id number]  Chief Complaint  Patient presents with   Prediabetes    Follow up    HPI Discussed the use of AI scribe software for clinical note transcription with the patient, who gave verbal consent to proceed.  History of Present Illness   Gregory Anthony is a 43 year old male who presents for follow-up on blood sugar management.  he has been making dietary changes and increasing physical activity. He has reduced his intake of white bread and red meat and is working out more frequently. His weight remains stable. He experiences no chest pain, shortness of breath, blurry vision, or increased frequency of urination. He is not checking blood sugar levels at home and is not interested in obtaining a glucose monitoring device.        ROS See pertinent positives and negatives per HPI.    Objective:     BP 124/82 (BP Location: Left Arm, Patient Position: Sitting, Cuff Size: Normal)   Pulse 65   Temp 97.6 F (36.4 C)   Ht 5' 9.75 (1.772 m)   Wt 216 lb (98 kg)   SpO2 99%   BMI 31.22 kg/m  BP Readings from Last 3 Encounters:  07/13/24 124/82  04/17/24 128/88  04/15/23 124/76   Wt Readings from Last 3 Encounters:  07/13/24 216 lb (98 kg)  04/17/24 214 lb 3.2 oz (97.2 kg)  04/15/23 199 lb (90.3 kg)      Physical Exam Vitals and nursing note reviewed.  Constitutional:      Appearance: Normal appearance.  HENT:     Head: Normocephalic.  Eyes:     Conjunctiva/sclera: Conjunctivae normal.  Cardiovascular:     Rate and Rhythm: Normal rate and regular rhythm.     Pulses: Normal pulses.     Heart sounds: Normal heart sounds.  Pulmonary:     Effort: Pulmonary effort is normal.     Breath sounds: Normal breath sounds.  Musculoskeletal:     Cervical back: Normal range of motion.  Skin:    General: Skin is warm.  Neurological:     General: No focal  deficit present.     Mental Status: He is alert and oriented to person, place, and time.  Psychiatric:        Mood and Affect: Mood normal.        Behavior: Behavior normal.        Thought Content: Thought content normal.        Judgment: Judgment normal.    Results for orders placed or performed in visit on 07/13/24  POCT glycosylated hemoglobin (Hb A1C)  Result Value Ref Range   Hemoglobin A1C 6.0 (A) 4.0 - 5.6 %   HbA1c POC (<> result, manual entry) 6.0 4.0 - 5.6 %   HbA1c, POC (prediabetic range) 6.0 5.7 - 6.4 %   HbA1c, POC (controlled diabetic range) 6.0 0.0 - 7.0 %      The 10-year ASCVD risk score (Arnett DK, et al., 2019) is: 3.4%    Assessment & Plan:   Problem List Items Addressed This Visit       Other   Prediabetes - Primary   Chronic, stable. A1c improved from 6.5% to 6.0%. Weight is stable and no hyperglycemia symptoms are present, indicating effective lifestyle changes. Continue lifestyle modifications and monitor for hyperglycemia symptoms. Follow-up in nine months to recheck  A1c.       Relevant Orders   POCT glycosylated hemoglobin (Hb A1C) (Completed)    Return in about 9 months (around 04/12/2025) for CPE.    Tinnie DELENA Harada, NP

## 2024-07-13 NOTE — Patient Instructions (Signed)
 It was great to see you!  Keep up the great work!!!  Your A1c, sugars over the last 3 months, was 6%   Let's follow-up in 9 months, sooner if you have concerns.  If a referral was placed today, you will be contacted for an appointment. Please note that routine referrals can sometimes take up to 3-4 weeks to process. Please call our office if you haven't heard anything after this time frame.  Take care,  Tinnie Harada, NP

## 2024-07-13 NOTE — Assessment & Plan Note (Signed)
 Chronic, stable. A1c improved from 6.5% to 6.0%. Weight is stable and no hyperglycemia symptoms are present, indicating effective lifestyle changes. Continue lifestyle modifications and monitor for hyperglycemia symptoms. Follow-up in nine months to recheck A1c.

## 2025-04-19 ENCOUNTER — Encounter: Admitting: Nurse Practitioner

## 2025-04-20 ENCOUNTER — Encounter: Admitting: Nurse Practitioner
